# Patient Record
Sex: Female | Born: 1985 | Hispanic: Yes | Marital: Married | Smoking: Never smoker
Health system: Southern US, Community
[De-identification: ages and names within clinical notes are randomized; demographics above are authoritative.]

## PROBLEM LIST (undated history)

## (undated) DIAGNOSIS — R011 Cardiac murmur, unspecified: Secondary | ICD-10-CM

## (undated) DIAGNOSIS — D62 Acute posthemorrhagic anemia: Secondary | ICD-10-CM

## (undated) DIAGNOSIS — J45909 Unspecified asthma, uncomplicated: Secondary | ICD-10-CM

---

## 2013-02-24 LAB — OB RESULTS CONSOLE RUBELLA ANTIBODY, IGM: Rubella: IMMUNE

## 2013-02-24 LAB — OB RESULTS CONSOLE HIV ANTIBODY (ROUTINE TESTING): HIV: NONREACTIVE

## 2013-02-24 LAB — OB RESULTS CONSOLE HEPATITIS B SURFACE ANTIGEN: Hepatitis B Surface Ag: NEGATIVE

## 2013-02-24 LAB — OB RESULTS CONSOLE ABO/RH: RH TYPE: POSITIVE

## 2013-02-24 LAB — OB RESULTS CONSOLE ANTIBODY SCREEN: Antibody Screen: NEGATIVE

## 2013-02-24 LAB — OB RESULTS CONSOLE RPR: RPR: NONREACTIVE

## 2013-02-28 LAB — OB RESULTS CONSOLE GC/CHLAMYDIA
Chlamydia: NEGATIVE
GC PROBE AMP, GENITAL: NEGATIVE

## 2013-05-27 LAB — OB RESULTS CONSOLE GBS: STREP GROUP B AG: NEGATIVE

## 2013-06-18 LAB — OB RESULTS CONSOLE RPR: RPR: NONREACTIVE

## 2013-09-01 ENCOUNTER — Inpatient Hospital Stay (HOSPITAL_COMMUNITY): Payer: BC Managed Care – PPO | Admitting: Anesthesiology

## 2013-09-01 ENCOUNTER — Inpatient Hospital Stay (HOSPITAL_COMMUNITY)
Admission: AD | Admit: 2013-09-01 | Discharge: 2013-09-04 | DRG: 765 | Disposition: A | Discharge status: Home / Self Care | Payer: BC Managed Care – PPO | Source: Ambulatory Visit | Attending: Obstetrics & Gynecology | Admitting: Obstetrics & Gynecology

## 2013-09-01 ENCOUNTER — Encounter (HOSPITAL_COMMUNITY): Payer: Self-pay | Admitting: *Deleted

## 2013-09-01 ENCOUNTER — Encounter (HOSPITAL_COMMUNITY): Payer: Self-pay | Admitting: Anesthesiology

## 2013-09-01 ENCOUNTER — Other Ambulatory Visit: Payer: Self-pay | Admitting: Obstetrics & Gynecology

## 2013-09-01 ENCOUNTER — Encounter (HOSPITAL_COMMUNITY)
Admission: AD | Disposition: A | Discharge status: Home / Self Care | Payer: Self-pay | Source: Ambulatory Visit | Attending: Obstetrics & Gynecology

## 2013-09-01 ENCOUNTER — Encounter (HOSPITAL_COMMUNITY): Payer: BC Managed Care – PPO | Admitting: Anesthesiology

## 2013-09-01 DIAGNOSIS — Z9889 Other specified postprocedural states: Secondary | ICD-10-CM

## 2013-09-01 DIAGNOSIS — D62 Acute posthemorrhagic anemia: Secondary | ICD-10-CM | POA: Diagnosis present

## 2013-09-01 DIAGNOSIS — O321XX0 Maternal care for breech presentation, not applicable or unspecified: Principal | ICD-10-CM | POA: Diagnosis present

## 2013-09-01 DIAGNOSIS — O9903 Anemia complicating the puerperium: Secondary | ICD-10-CM | POA: Diagnosis present

## 2013-09-01 DIAGNOSIS — J45909 Unspecified asthma, uncomplicated: Secondary | ICD-10-CM | POA: Diagnosis present

## 2013-09-01 HISTORY — DX: Unspecified asthma, uncomplicated: J45.909

## 2013-09-01 HISTORY — DX: Cardiac murmur, unspecified: R01.1

## 2013-09-01 LAB — TYPE AND SCREEN
ABO/RH(D): O POS
ANTIBODY SCREEN: NEGATIVE

## 2013-09-01 LAB — CBC
HCT: 33.8 % — ABNORMAL LOW (ref 36.0–46.0)
HEMOGLOBIN: 11.3 g/dL — AB (ref 12.0–15.0)
MCH: 29.1 pg (ref 26.0–34.0)
MCHC: 33.4 g/dL (ref 30.0–36.0)
MCV: 87.1 fL (ref 78.0–100.0)
Platelets: 252 10*3/uL (ref 150–400)
RBC: 3.88 MIL/uL (ref 3.87–5.11)
RDW: 13.2 % (ref 11.5–15.5)
WBC: 6.7 10*3/uL (ref 4.0–10.5)

## 2013-09-01 LAB — RPR: RPR: NONREACTIVE

## 2013-09-01 LAB — ABO/RH: ABO/RH(D): O POS

## 2013-09-01 SURGERY — Surgical Case
Anesthesia: Spinal | Site: Abdomen

## 2013-09-01 MED ORDER — OXYTOCIN 40 UNITS IN LACTATED RINGERS INFUSION - SIMPLE MED: 62.5000 mL/h | INTRAVENOUS | Status: DC

## 2013-09-01 MED ORDER — IBUPROFEN 600 MG PO TABS: 600.0000 mg | ORAL_TABLET | Freq: Four times a day (QID) | ORAL | Status: DC | PRN

## 2013-09-01 MED ORDER — KETOROLAC TROMETHAMINE 30 MG/ML IJ SOLN
30.0000 mg | Freq: Four times a day (QID) | INTRAMUSCULAR | Status: DC | PRN
  Administered 2013-09-02: 30 mg via INTRAMUSCULAR

## 2013-09-01 MED ORDER — KETOROLAC TROMETHAMINE 30 MG/ML IJ SOLN: 30.0000 mg | Freq: Four times a day (QID) | INTRAMUSCULAR | Status: DC | PRN

## 2013-09-01 MED ORDER — OXYCODONE-ACETAMINOPHEN 5-325 MG PO TABS: 1.0000 | ORAL_TABLET | ORAL | Status: DC | PRN

## 2013-09-01 MED ORDER — ONDANSETRON HCL 4 MG/2ML IJ SOLN
INTRAMUSCULAR | Status: AC
  Filled 2013-09-01: qty 2

## 2013-09-01 MED ORDER — FENTANYL CITRATE 0.05 MG/ML IJ SOLN
INTRAMUSCULAR | Status: DC | PRN
  Administered 2013-09-01: 25 ug via INTRATHECAL

## 2013-09-01 MED ORDER — LACTATED RINGERS IV SOLN: 500.0000 mL | INTRAVENOUS | Status: DC | PRN

## 2013-09-01 MED ORDER — PHENYLEPHRINE 8 MG IN D5W 100 ML (0.08MG/ML) PREMIX OPTIME
INJECTION | INTRAVENOUS | Status: AC
  Filled 2013-09-01: qty 100

## 2013-09-01 MED ORDER — ACETAMINOPHEN 325 MG PO TABS: 650.0000 mg | ORAL_TABLET | ORAL | Status: DC | PRN

## 2013-09-01 MED ORDER — MORPHINE SULFATE (PF) 0.5 MG/ML IJ SOLN
INTRAMUSCULAR | Status: DC | PRN
  Administered 2013-09-01: .15 mg via INTRATHECAL

## 2013-09-01 MED ORDER — PHENYLEPHRINE 40 MCG/ML (10ML) SYRINGE FOR IV PUSH (FOR BLOOD PRESSURE SUPPORT)
80.0000 ug | PREFILLED_SYRINGE | INTRAVENOUS | Status: DC | PRN
  Filled 2013-09-01: qty 10

## 2013-09-01 MED ORDER — CITRIC ACID-SODIUM CITRATE 334-500 MG/5ML PO SOLN
30.0000 mL | ORAL | Status: DC | PRN
  Administered 2013-09-01: 30 mL via ORAL
  Filled 2013-09-01: qty 15

## 2013-09-01 MED ORDER — EPHEDRINE 5 MG/ML INJ: 10.0000 mg | INTRAVENOUS | Status: DC | PRN

## 2013-09-01 MED ORDER — BUPIVACAINE HCL (PF) 0.25 % IJ SOLN
INTRAMUSCULAR | Status: DC | PRN
  Administered 2013-09-01: 20 mL

## 2013-09-01 MED ORDER — LACTATED RINGERS IV SOLN
INTRAVENOUS | Status: DC
  Administered 2013-09-01 (×4): via INTRAVENOUS

## 2013-09-01 MED ORDER — FENTANYL CITRATE 0.05 MG/ML IJ SOLN
INTRAMUSCULAR | Status: AC
  Filled 2013-09-01: qty 2

## 2013-09-01 MED ORDER — OXYTOCIN BOLUS FROM INFUSION: 500.0000 mL | INTRAVENOUS | Status: DC

## 2013-09-01 MED ORDER — EPHEDRINE 5 MG/ML INJ
10.0000 mg | INTRAVENOUS | Status: DC | PRN
  Filled 2013-09-01: qty 4

## 2013-09-01 MED ORDER — MORPHINE SULFATE 0.5 MG/ML IJ SOLN
INTRAMUSCULAR | Status: AC
  Filled 2013-09-01: qty 10

## 2013-09-01 MED ORDER — ONDANSETRON HCL 4 MG/2ML IJ SOLN: 4.0000 mg | Freq: Four times a day (QID) | INTRAMUSCULAR | Status: DC | PRN

## 2013-09-01 MED ORDER — LIDOCAINE HCL (PF) 1 % IJ SOLN: 30.0000 mL | INTRAMUSCULAR | Status: DC | PRN

## 2013-09-01 MED ORDER — FLEET ENEMA 7-19 GM/118ML RE ENEM: 1.0000 | ENEMA | RECTAL | Status: DC | PRN

## 2013-09-01 MED ORDER — OXYTOCIN 10 UNIT/ML IJ SOLN
40.0000 [IU] | INTRAVENOUS | Status: DC | PRN
  Administered 2013-09-01: 40 [IU] via INTRAVENOUS

## 2013-09-01 MED ORDER — OXYTOCIN 10 UNIT/ML IJ SOLN
INTRAMUSCULAR | Status: AC
  Filled 2013-09-01: qty 4

## 2013-09-01 MED ORDER — HYDROMORPHONE HCL PF 1 MG/ML IJ SOLN
0.2500 mg | INTRAMUSCULAR | Status: DC | PRN
  Administered 2013-09-02 (×4): 0.5 mg via INTRAVENOUS

## 2013-09-01 MED ORDER — ONDANSETRON HCL 4 MG/2ML IJ SOLN
INTRAMUSCULAR | Status: DC | PRN
  Administered 2013-09-01: 4 mg via INTRAVENOUS

## 2013-09-01 MED ORDER — SCOPOLAMINE 1 MG/3DAYS TD PT72
1.0000 | MEDICATED_PATCH | Freq: Once | TRANSDERMAL | Status: DC
  Administered 2013-09-02: 1.5 mg via TRANSDERMAL

## 2013-09-01 MED ORDER — PHENYLEPHRINE 40 MCG/ML (10ML) SYRINGE FOR IV PUSH (FOR BLOOD PRESSURE SUPPORT): 80.0000 ug | PREFILLED_SYRINGE | INTRAVENOUS | Status: DC | PRN

## 2013-09-01 MED ORDER — MEPERIDINE HCL 25 MG/ML IJ SOLN: 6.2500 mg | INTRAMUSCULAR | Status: DC | PRN

## 2013-09-01 MED ORDER — TERBUTALINE SULFATE 1 MG/ML IJ SOLN: 0.2500 mg | Freq: Once | INTRAMUSCULAR | Status: AC | PRN

## 2013-09-01 MED ORDER — LACTATED RINGERS IV SOLN
500.0000 mL | Freq: Once | INTRAVENOUS | Status: AC
  Administered 2013-09-01: 500 mL via INTRAVENOUS

## 2013-09-01 MED ORDER — BUPIVACAINE HCL (PF) 0.25 % IJ SOLN
INTRAMUSCULAR | Status: AC
  Filled 2013-09-01: qty 10

## 2013-09-01 MED ORDER — FENTANYL 2.5 MCG/ML BUPIVACAINE 1/10 % EPIDURAL INFUSION (WH - ANES)
14.0000 mL/h | INTRAMUSCULAR | Status: DC | PRN
  Filled 2013-09-01: qty 125

## 2013-09-01 MED ORDER — PHENYLEPHRINE 8 MG IN D5W 100 ML (0.08MG/ML) PREMIX OPTIME
INJECTION | INTRAVENOUS | Status: DC | PRN
  Administered 2013-09-01: 80 ug/min via INTRAVENOUS

## 2013-09-01 MED ORDER — DIPHENHYDRAMINE HCL 50 MG/ML IJ SOLN: 12.5000 mg | INTRAMUSCULAR | Status: DC | PRN

## 2013-09-01 MED ORDER — OXYTOCIN 40 UNITS IN LACTATED RINGERS INFUSION - SIMPLE MED
1.0000 m[IU]/min | INTRAVENOUS | Status: DC
  Administered 2013-09-01: 2 m[IU]/min via INTRAVENOUS
  Filled 2013-09-01: qty 1000

## 2013-09-01 MED ORDER — CEFAZOLIN SODIUM-DEXTROSE 2-3 GM-% IV SOLR
2.0000 g | Freq: Once | INTRAVENOUS | Status: AC
  Administered 2013-09-01: 2 g via INTRAVENOUS
  Filled 2013-09-01: qty 50

## 2013-09-01 MED ORDER — BUPIVACAINE HCL (PF) 0.25 % IJ SOLN
INTRAMUSCULAR | Status: AC
  Filled 2013-09-01: qty 20

## 2013-09-01 SURGICAL SUPPLY — 36 items
CLAMP CORD UMBIL (MISCELLANEOUS) IMPLANT
CLOTH BEACON ORANGE TIMEOUT ST (SAFETY) ×3 IMPLANT
CONTAINER PREFILL 10% NBF 15ML (MISCELLANEOUS) IMPLANT
DRAPE LG THREE QUARTER DISP (DRAPES) IMPLANT
DRSG OPSITE POSTOP 4X10 (GAUZE/BANDAGES/DRESSINGS) ×3 IMPLANT
DURAPREP 26ML APPLICATOR (WOUND CARE) ×3 IMPLANT
ELECT REM PT RETURN 9FT ADLT (ELECTROSURGICAL) ×3
ELECTRODE REM PT RTRN 9FT ADLT (ELECTROSURGICAL) ×1 IMPLANT
EXTRACTOR VACUUM M CUP 4 TUBE (SUCTIONS) IMPLANT
EXTRACTOR VACUUM M CUP 4' TUBE (SUCTIONS)
GLOVE BIO SURGEON STRL SZ 6.5 (GLOVE) ×2 IMPLANT
GLOVE BIO SURGEONS STRL SZ 6.5 (GLOVE) ×1
GLOVE BIOGEL PI IND STRL 7.0 (GLOVE) ×1 IMPLANT
GLOVE BIOGEL PI INDICATOR 7.0 (GLOVE) ×2
GOWN STRL REUS W/TWL LRG LVL3 (GOWN DISPOSABLE) ×6 IMPLANT
KIT ABG SYR 3ML LUER SLIP (SYRINGE) IMPLANT
NEEDLE HYPO 22GX1.5 SAFETY (NEEDLE) ×3 IMPLANT
NEEDLE HYPO 25X5/8 SAFETYGLIDE (NEEDLE) IMPLANT
PACK C SECTION WH (CUSTOM PROCEDURE TRAY) ×3 IMPLANT
PAD OB MATERNITY 4.3X12.25 (PERSONAL CARE ITEMS) ×3 IMPLANT
RTRCTR C-SECT PINK 25CM LRG (MISCELLANEOUS) ×3 IMPLANT
SPONGE LAP 18X18 X RAY DECT (DISPOSABLE) ×3 IMPLANT
STAPLER VISISTAT 35W (STAPLE) ×3 IMPLANT
SUT PLAIN 0 NONE (SUTURE) IMPLANT
SUT VIC AB 0 CT1 27 (SUTURE) ×4
SUT VIC AB 0 CT1 27XBRD ANBCTR (SUTURE) ×2 IMPLANT
SUT VIC AB 0 CTX 36 (SUTURE) ×8
SUT VIC AB 0 CTX36XBRD ANBCTRL (SUTURE) ×4 IMPLANT
SUT VIC AB 2-0 CT1 27 (SUTURE) ×2
SUT VIC AB 2-0 CT1 TAPERPNT 27 (SUTURE) ×1 IMPLANT
SUT VIC AB 3-0 SH 27 (SUTURE)
SUT VIC AB 3-0 SH 27X BRD (SUTURE) IMPLANT
SYR CONTROL 10ML LL (SYRINGE) ×3 IMPLANT
TOWEL OR 17X24 6PK STRL BLUE (TOWEL DISPOSABLE) ×3 IMPLANT
TRAY FOLEY CATH 14FR (SET/KITS/TRAYS/PACK) ×3 IMPLANT
WATER STERILE IRR 1000ML POUR (IV SOLUTION) IMPLANT

## 2013-09-01 NOTE — Anesthesia Preprocedure Evaluation (Signed)
Anesthesia Evaluation  Patient identified by MRN, date of birth, ID band Patient awake    Reviewed: Allergy & Precautions, H&P , Patient's Chart, lab work & pertinent test results  Airway Mallampati: II TM Distance: >3 FB Neck ROM: full    Dental no notable dental hx.    Pulmonary asthma (no inhaler x 2 years) ,  breath sounds clear to auscultation  Pulmonary exam normal       Cardiovascular Exercise Tolerance: Good Rhythm:regular Rate:Normal     Neuro/Psych    GI/Hepatic   Endo/Other    Renal/GU      Musculoskeletal   Abdominal   Peds  Hematology   Anesthesia Other Findings   Reproductive/Obstetrics                           Anesthesia Physical Anesthesia Plan  ASA: II  Anesthesia Plan: Spinal   Post-op Pain Management:    Induction:   Airway Management Planned:   Additional Equipment:   Intra-op Plan:   Post-operative Plan:   Informed Consent: I have reviewed the patients History and Physical, chart, labs and discussed the procedure including the risks, benefits and alternatives for the proposed anesthesia with the patient or authorized representative who has indicated his/her understanding and acceptance.   Dental Advisory Given  Plan Discussed with: CRNA  Anesthesia Plan Comments: (Lab work confirmed with CRNA in room. Platelets okay. Discussed spinal anesthetic, and patient consents to the procedure:  included risk of possible headache,backache, failed block, allergic reaction, and nerve injury. This patient was asked if she had any questions or concerns before the procedure started. )        Anesthesia Quick Evaluation

## 2013-09-01 NOTE — Progress Notes (Signed)
Patient refused vaginal exam

## 2013-09-01 NOTE — Consult Note (Signed)
Neonatology Note:  Attendance at C-section:  I was asked by Dr. Lavoie to attend this primary C/S at term due to breech presentation, in labor. The mother is a G1P0 O pos, GBS neg with an otherwise uncomplicated pregnancy. ROM 15 hours prior to delivery, fluid clear. Infant a little floppy initially, but cried with bulb suctioning and pinked up quickly. Tone normal by 2-3 min of life. Needed bulb suctioning for clear secretions. Ap 8/9. Lungs clear to ausc in DR. To CN to care of Pediatrician.  Macenzie Burford C. Hawley Michel, MD  

## 2013-09-01 NOTE — Progress Notes (Signed)
Bedside U/S performed by Dr. Seymour BarsLavoie, breech position identified. Dr. Seymour BarsLavoie discussed c-section with patient.

## 2013-09-01 NOTE — Progress Notes (Signed)
Subjective: SROM, Pito augmentation Doing well, pain ++, UCs q3 min  Anesthesia none   Objective: BP 113/82  Pulse 89  Temp(Src) 98.2 F (36.8 C) (Oral)  Resp 18  Ht 5\' 2"  (1.575 m)  Wt 54.885 kg (121 lb)  BMI 22.13 kg/m2   FHT:  FHR: 140 bpm, variability: moderate,  accelerations:  Present,  decelerations:  Absent UC:   regular, every 3 minutes VE:   Dilation: 4 Effacement (%): 70 Station: -3 Exam by:: Dr. Seymour BarsLavoie  US at bedside because still high presentation and difficult exam due to pain:  Breech presentation.  Assessment / Plan: Augmentation of labor, progressing well, but breech presentation per bedside US.  Fetal Wellbeing:  Category I Pain Control:  none  Anticipated MOD:  Urgent C/S for Breech in labor.  Surgery and risks reviewed including trauma, hemorrhage, infection, DVT/PE, Anesthesia Cx.  Informed consent signed.  Lonette Stevison,MARIE-LYNE 09/01/2013, 10:47 PM

## 2013-09-01 NOTE — H&P (Signed)
Subjective:  Joy ClevelandJessica Hannold is a 28 y.o. G1 P0 female with EDC 09/08/2013 at 3839 and 0/[redacted] weeks gestation who is being admitted for labor management.  Her current obstetrical history is normal.  Patient reports clear AF leak x 8:30 am.   Fetal Movement: normal.     Objective:   Vital signs in last 24 hours: Temp:  [98.6 F (37 C)-99.3 F (37.4 C)] 99.3 F (37.4 C) (02/09 1311) Pulse Rate:  [87-123] 93 (02/09 1500) Resp:  [18-20] 18 (02/09 1500) BP: (109-128)/(74-80) 111/78 mmHg (02/09 1500) Weight:  [54.885 kg (121 lb)] 54.885 kg (121 lb) (02/09 1119)   General:   alert  Skin:   normal  HEENT:  PERRLA  Lungs:   clear to auscultation bilaterally  Heart:  RCR  Breasts:   Deferred  Abdomen:  Gravid  Pelvis:  Exam deferred.  FHT:  140 BPM, good variability, no deceleration  Uterine Size: size equals dates  Presentations: cephalic  Cervix:    Dilation: 1cm   Effacement: 50%   Station:  -2   Consistency: soft   Position: middle   Lab Review  Rh+  AFP:NML  One hour GTT: Normal   GBS neg  Assessment/Plan:  39 and 0/[redacted] weeks gestation.  SROM. Early latent labor.  Pitocin augmentation. Obstetrical history is normal.  Fetal well-being is reassuring.    Risks, benefits, alternatives and possible complications have been discussed in detail with the patient.  Pre-admission, admission, and post admission procedures and expectations were discussed in detail.  All questions answered, all appropriate consents will be signed at the Hospital. Admission is planned for today.  Expectant management. and Anticipate vaginal delivery.

## 2013-09-01 NOTE — Anesthesia Procedure Notes (Signed)

## 2013-09-01 NOTE — Progress Notes (Signed)
Pt transferred to OR via stretcher, no s/s of distress noted.

## 2013-09-02 ENCOUNTER — Encounter (HOSPITAL_COMMUNITY): Payer: Self-pay

## 2013-09-02 DIAGNOSIS — Z9889 Other specified postprocedural states: Secondary | ICD-10-CM

## 2013-09-02 LAB — CBC
HCT: 26.2 % — ABNORMAL LOW (ref 36.0–46.0)
HEMOGLOBIN: 8.8 g/dL — AB (ref 12.0–15.0)
MCH: 29.2 pg (ref 26.0–34.0)
MCHC: 33.6 g/dL (ref 30.0–36.0)
MCV: 87 fL (ref 78.0–100.0)
Platelets: 182 10*3/uL (ref 150–400)
RBC: 3.01 MIL/uL — AB (ref 3.87–5.11)
RDW: 13.3 % (ref 11.5–15.5)
WBC: 11 10*3/uL — ABNORMAL HIGH (ref 4.0–10.5)

## 2013-09-02 MED ORDER — SIMETHICONE 80 MG PO CHEW
80.0000 mg | CHEWABLE_TABLET | Freq: Three times a day (TID) | ORAL | Status: DC
  Administered 2013-09-02 – 2013-09-04 (×7): 80 mg via ORAL
  Filled 2013-09-02 (×7): qty 1

## 2013-09-02 MED ORDER — MAGNESIUM HYDROXIDE 400 MG/5ML PO SUSP: 30.0000 mL | ORAL | Status: DC | PRN

## 2013-09-02 MED ORDER — SIMETHICONE 80 MG PO CHEW: 80.0000 mg | CHEWABLE_TABLET | ORAL | Status: DC | PRN

## 2013-09-02 MED ORDER — KETOROLAC TROMETHAMINE 30 MG/ML IJ SOLN
INTRAMUSCULAR | Status: AC
  Administered 2013-09-02: 30 mg via INTRAMUSCULAR
  Filled 2013-09-02: qty 1

## 2013-09-02 MED ORDER — ONDANSETRON HCL 4 MG/2ML IJ SOLN: 4.0000 mg | INTRAMUSCULAR | Status: DC | PRN

## 2013-09-02 MED ORDER — HYDROMORPHONE HCL PF 1 MG/ML IJ SOLN
INTRAMUSCULAR | Status: AC
  Administered 2013-09-02: 0.5 mg via INTRAVENOUS
  Filled 2013-09-02: qty 1

## 2013-09-02 MED ORDER — METOCLOPRAMIDE HCL 5 MG/ML IJ SOLN
10.0000 mg | Freq: Three times a day (TID) | INTRAMUSCULAR | Status: DC | PRN
Start: 2013-09-02 — End: 2013-09-04

## 2013-09-02 MED ORDER — IBUPROFEN 600 MG PO TABS
600.0000 mg | ORAL_TABLET | Freq: Four times a day (QID) | ORAL | Status: DC
  Administered 2013-09-02 – 2013-09-04 (×9): 600 mg via ORAL
  Filled 2013-09-02 (×9): qty 1

## 2013-09-02 MED ORDER — MENTHOL 3 MG MT LOZG: 1.0000 | LOZENGE | OROMUCOSAL | Status: DC | PRN

## 2013-09-02 MED ORDER — LACTATED RINGERS IV SOLN: INTRAVENOUS | Status: DC

## 2013-09-02 MED ORDER — NALBUPHINE HCL 10 MG/ML IJ SOLN: 5.0000 mg | INTRAMUSCULAR | Status: DC | PRN

## 2013-09-02 MED ORDER — DIPHENHYDRAMINE HCL 50 MG/ML IJ SOLN: 25.0000 mg | INTRAMUSCULAR | Status: DC | PRN

## 2013-09-02 MED ORDER — OXYTOCIN 40 UNITS IN LACTATED RINGERS INFUSION - SIMPLE MED: 62.5000 mL/h | INTRAVENOUS | Status: AC

## 2013-09-02 MED ORDER — OXYCODONE-ACETAMINOPHEN 5-325 MG PO TABS
1.0000 | ORAL_TABLET | ORAL | Status: DC | PRN
  Administered 2013-09-02 – 2013-09-03 (×5): 1 via ORAL
  Administered 2013-09-04: 2 via ORAL
  Administered 2013-09-04: 1 via ORAL
  Administered 2013-09-04 (×2): 2 via ORAL
  Filled 2013-09-02 (×3): qty 1
  Filled 2013-09-02: qty 2
  Filled 2013-09-02: qty 1
  Filled 2013-09-02: qty 2
  Filled 2013-09-02 (×2): qty 1
  Filled 2013-09-02: qty 2
  Filled 2013-09-02: qty 1

## 2013-09-02 MED ORDER — WITCH HAZEL-GLYCERIN EX PADS: 1.0000 "application " | MEDICATED_PAD | CUTANEOUS | Status: DC | PRN

## 2013-09-02 MED ORDER — SCOPOLAMINE 1 MG/3DAYS TD PT72
MEDICATED_PATCH | TRANSDERMAL | Status: AC
  Administered 2013-09-02: 1.5 mg via TRANSDERMAL
  Filled 2013-09-02: qty 1

## 2013-09-02 MED ORDER — LANOLIN HYDROUS EX OINT: 1.0000 "application " | TOPICAL_OINTMENT | CUTANEOUS | Status: DC | PRN

## 2013-09-02 MED ORDER — DIBUCAINE 1 % RE OINT: 1.0000 "application " | TOPICAL_OINTMENT | RECTAL | Status: DC | PRN

## 2013-09-02 MED ORDER — PRENATAL MULTIVITAMIN CH
1.0000 | ORAL_TABLET | Freq: Every day | ORAL | Status: DC
  Administered 2013-09-02 – 2013-09-04 (×3): 1 via ORAL
  Filled 2013-09-02 (×3): qty 1

## 2013-09-02 MED ORDER — SENNOSIDES-DOCUSATE SODIUM 8.6-50 MG PO TABS
2.0000 | ORAL_TABLET | ORAL | Status: DC
  Administered 2013-09-03 (×2): 2 via ORAL
  Filled 2013-09-02 (×2): qty 2

## 2013-09-02 MED ORDER — DIPHENHYDRAMINE HCL 25 MG PO CAPS: 25.0000 mg | ORAL_CAPSULE | Freq: Four times a day (QID) | ORAL | Status: DC | PRN

## 2013-09-02 MED ORDER — SODIUM CHLORIDE 0.9 % IJ SOLN: 3.0000 mL | INTRAMUSCULAR | Status: DC | PRN

## 2013-09-02 MED ORDER — TETANUS-DIPHTH-ACELL PERTUSSIS 5-2.5-18.5 LF-MCG/0.5 IM SUSP: 0.5000 mL | Freq: Once | INTRAMUSCULAR | Status: DC

## 2013-09-02 MED ORDER — ZOLPIDEM TARTRATE 5 MG PO TABS: 5.0000 mg | ORAL_TABLET | Freq: Every evening | ORAL | Status: DC | PRN

## 2013-09-02 MED ORDER — ONDANSETRON HCL 4 MG/2ML IJ SOLN
4.0000 mg | Freq: Three times a day (TID) | INTRAMUSCULAR | Status: DC | PRN
Start: 2013-09-02 — End: 2013-09-04

## 2013-09-02 MED ORDER — DIPHENHYDRAMINE HCL 50 MG/ML IJ SOLN: 12.5000 mg | INTRAMUSCULAR | Status: DC | PRN

## 2013-09-02 MED ORDER — SIMETHICONE 80 MG PO CHEW
80.0000 mg | CHEWABLE_TABLET | ORAL | Status: DC
  Administered 2013-09-03 (×2): 80 mg via ORAL
  Filled 2013-09-02 (×2): qty 1

## 2013-09-02 MED ORDER — LACTATED RINGERS IV SOLN
INTRAVENOUS | Status: DC | PRN
  Administered 2013-09-02: via INTRAVENOUS

## 2013-09-02 MED ORDER — ONDANSETRON HCL 4 MG PO TABS: 4.0000 mg | ORAL_TABLET | ORAL | Status: DC | PRN

## 2013-09-02 MED ORDER — NALOXONE HCL 1 MG/ML IJ SOLN
1.0000 ug/kg/h | INTRAVENOUS | Status: DC | PRN
  Filled 2013-09-02: qty 2

## 2013-09-02 MED ORDER — DIPHENHYDRAMINE HCL 25 MG PO CAPS: 25.0000 mg | ORAL_CAPSULE | ORAL | Status: DC | PRN

## 2013-09-02 MED ORDER — NALOXONE HCL 0.4 MG/ML IJ SOLN: 0.4000 mg | INTRAMUSCULAR | Status: DC | PRN

## 2013-09-02 MED ORDER — FERROUS SULFATE 325 (65 FE) MG PO TABS
325.0000 mg | ORAL_TABLET | Freq: Two times a day (BID) | ORAL | Status: DC
  Administered 2013-09-02 – 2013-09-04 (×6): 325 mg via ORAL
  Filled 2013-09-02 (×6): qty 1

## 2013-09-02 NOTE — Anesthesia Postprocedure Evaluation (Signed)
  Anesthesia Post-op Note  Patient: Joy Farmer  Procedure(s) Performed: * No procedures listed *  Patient Location: PACU and Mother/Baby  Anesthesia Type:Epidural  Level of Consciousness: awake, alert  and oriented  Airway and Oxygen Therapy: Patient Spontanous Breathing  Post-op Pain: mild  Post-op Assessment: Patient's Cardiovascular Status Stable, Respiratory Function Stable, No signs of Nausea or vomiting, Adequate PO intake and Pain level controlled  Post-op Vital Signs: stable  Complications: No apparent anesthesia complications

## 2013-09-02 NOTE — Transfer of Care (Deleted)
Immediate Anesthesia Transfer of Care Note  Patient: Joy Farmer  Procedure(s) Performed: Procedure(s): CESAREAN SECTION (N/A)  Patient Location: PACU  Anesthesia Type:Spinal  Level of Consciousness: awake, alert , oriented and patient cooperative  Airway & Oxygen Therapy: Patient Spontanous Breathing  Post-op Assessment: Report given to PACU RN and Post -op Vital signs reviewed and stable  Post vital signs: Reviewed and stable  Complications: No apparent anesthesia complications 

## 2013-09-02 NOTE — Lactation Note (Signed)
This note was copied from the chart of Joy Farmer Cotta. Lactation Consultation Note  Patient Name: Joy Farmer Haning ZOXWR'UToday's Date: 09/02/2013 Reason for consult: Follow-up assessment of this primipara and her baby, now 20 hours of age and having some latch difficulty due to shallow latch.  FOB at bedside to assist and is very supportive.  Mom has baby well-positioned in sidelying football position to (R) breast and baby is latched and lips flanged wide but mom c/o pinching of nipple. LC attempting to adjust baby and he slipped off, so LC demonstrated chin tug technique while baby latches to ensure deeper areolar grasp.  Mom reports some persistent nipple tenderness but some improvement of nipple pain and baby sustained latch >8 minutes and some swallows initially which increased with let-down and were more frequent.  LC also demonstrated stimulation techniques for FOB to use if baby pauses too long.  RN, Randa EvensJoanne also observed a few minutes of this feeding.  LC encouraged cue feeding on one or both breasts and cautioned mom not to pull back on breast tissue while baby is latched but encouraged breast compressions in direction of baby's mouth to enhance let-down and milk flow for baby.   Maternal Data    Feeding Feeding Type: Breast Fed Length of feed:  (observed >8 minutes of deep latch)  LATCH Score/Interventions Latch: Grasps breast easily, tongue down, lips flanged, rhythmical sucking. (chin tug improved latch, achieving deeper areolar grasp) Intervention(s): Skin to skin (chin tug and stimulation technqiues) Intervention(s): Assist with latch;Breast compression (cautioned mom not to pull breast tissue taut)  Audible Swallowing: Spontaneous and intermittent (let-down heard after first 5 minutes) Intervention(s): Skin to skin  Type of Nipple: Everted at rest and after stimulation (firm breasts, short nipples)  Comfort (Breast/Nipple): Filling, red/small blisters or bruises, mild/mod  discomfort (mom reports some tenderness but improvement w/deeper latch)  Problem noted: Mild/Moderate discomfort Interventions (Mild/moderate discomfort): Hand massage  Hold (Positioning): Assistance needed to correctly position infant at breast and maintain latch. Intervention(s): Skin to skin  LATCH Score: 8 (LC and RN observed)  Lactation Tools Discussed/Used   Chin tug STS and stimulation technqiues  Consult Status Consult Status: Follow-up Date: 09/03/13 Follow-up type: In-patient    Warrick ParisianBryant, Yalena Colon [facility] 09/02/2013, 8:40 PM

## 2013-09-02 NOTE — Op Note (Signed)
Preoperative diagnosis: Intrauterine pregnancy at 39 weeks and 0 days                                             Breech presentation in labor  Post operative diagnosis: Same  Anesthesia: Spinal  Anesthesiologist: Dr. Cristela BlueKyle Jackson  Procedure: Urgent primary low transverse cesarean section  Surgeon: Dr. Genia DelMarie-Lyne Melek Pownall  Assistant: none   Estimated blood loss: 700 cc  Procedure:  After being informed of the planned procedure and possible complications including bleeding, infection, injury to other organs, informed consent is obtained. The patient is taken to OR #9 and given spinal anesthesia without complication. She is placed in the dorsal decubitus position with the pelvis tilted to the left. She is then prepped and draped in a sterile fashion. A Foley catheter is inserted in her bladder.  After assessing adequate level of anesthesia, we infiltrate the suprapubic area with 20 cc of Marcaine 0.25 and perform a Pfannenstiel incision which is brought down sharply to the fascia. The fascia is entered in a low transverse fashion. Linea alba is dissected. Peritoneum is entered in a midline fashion. An Alexis retractor is easily positioned. Visceral peritoneum is entered in a low transverse fashion allowing us to safely retract bladder by developing a bladder flap.  The myometrium is then entered in a low transverse fashion; first with knife and then extended bluntly. Amniotic fluid is clear. We assist the birth of a female  infant in complete breech presentation. Mouth and nose are suctioned. The baby is delivered. The cord is clamped and sectioned. The baby is given to the neonatologist present in the room.  10 cc of blood is drawn from the umbilical vein.The placenta is allowed to deliver spontaneously. It is complete and the cord has 3 vessels. Uterine revision is negative.  We proceed with closure of the myometrium in 2 layers: First with a running locked suture of 0 Vicryl, then with a  Lembert suture of 0 Vicryl imbricating the first one. Hemostasis is completed with cauterization on peritoneal edges.  Both paracolic gutters are cleaned. Both tubes and ovaries are assessed and normal.  We confirm a satisfactory hemostasis.  Retractors and sponges are removed. Under fascia hemostasis is completed with cauterization.  The parietal peritoneum is closed with a running suture of Vicryl 0.  The fascia is then closed with 2 running sutures of 0 Vicryl meeting midline. The wound is irrigated with warm saline and hemostasis is completed with cauterization. The skin is closed with staples and a Honeycomb dressing is applied.   Instrument and sponge count is complete x2. Estimated blood loss is 700 cc.  The procedure is well tolerated by the patient who is taken to recovery room in a well and stable condition.  female baby named Theron Aristaeter was born at 23:48 and received an Apgar of 8  at 1 minute and 9 at 5 minutes. Weight was pending.    Specimen: Placenta sent to patho   Chasin Findling,MARIE-LYNE MD 2/10/201512:31 AM

## 2013-09-02 NOTE — Progress Notes (Signed)
Patient ID: Joy Farmer, female   DOB: 12/19/1985, 28 y.o.   MRN: [id number] POD # 1  Subjective: Pt reports feeling "groggy" , "sore" / Pain controlled with intra op meds; has not taken percocet Tolerating po/ Foley patent and draining pale, yellow urine, clear / No n/v/Flatus neg Activity: up with assistance; SCD's in place Bleeding is light Newborn info:  Information for the patient's newborn:  Joy Farmer, Boy Leontyne [[id number]][[id number]]  female  / circ desired / Feeding: breast   Objective: VS: Blood pressure 114/73, pulse 112, temperature 99.2 F (37.3 C), temperature source Oral, resp. rate 18.    Intake/Output Summary (Last 24 hours) at 09/02/13 0914 Last data filed at 09/02/13 0032  Gross per 24 hour  Intake   2700 ml  Output   1300 ml  Net   1400 ml      Recent Labs  09/01/13 1300 09/02/13 0615  WBC 6.7 11.0*  HGB 11.3* 8.8*  HCT 33.8* 26.2*  PLT 252 182    Blood type: O POS Rubella: Immune    Physical Exam:  General: alert, cooperative and no distress CV: Regular rate and rhythm Resp: clear Abdomen: soft, nontender, normal bowel sounds Incision: Covered with Tegaderm and honeycomb dressing; well approximated.  Uterine Fundus: firm, below umbilicus, nontender Lochia: minimal Ext: SCD's in place    A/P: POD # 1/ G1P1001 S/P Primary C/Section d/t Breech in labor Chronic anemia compounded by ABL Anemia Doing well Continue routine post op orders   Signed: Demetrius RevelFISHER,Anyia Gierke K, MSN, [facility] 09/02/2013, 9:14 AM

## 2013-09-02 NOTE — Transfer of Care (Signed)
Immediate Anesthesia Transfer of Care Note  Patient: Dollene ClevelandJessica Kissick  Procedure(s) Performed: Procedure(s): CESAREAN SECTION (N/A)  Patient Location: PACU  Anesthesia Type:Spinal  Level of Consciousness: awake, alert , oriented and patient cooperative  Airway & Oxygen Therapy: Patient Spontanous Breathing  Post-op Assessment: Report given to PACU RN and Post -op Vital signs reviewed and stable  Post vital signs: Reviewed and stable  Complications: No apparent anesthesia complications

## 2013-09-02 NOTE — Anesthesia Postprocedure Evaluation (Signed)
  Anesthesia Post-op Note  Patient: Joy Farmer  Procedure(s) Performed: Procedure(s): CESAREAN SECTION (N/A)  Patient is awake, responsive, moving her legs, and has signs of resolution of her numbness. Pain and nausea are reasonably well controlled. Vital signs are stable and clinically acceptable. Oxygen saturation is clinically acceptable. There are no apparent anesthetic complications at this time. Patient is ready for discharge.

## 2013-09-02 NOTE — Lactation Note (Signed)
This note was copied from the chart of Boy Dollene ClevelandJessica Shively. Lactation Consultation Note Initial consultation, first time mom, baby now 7414 hours old. Mom states baby has been sleepy and has not had a good feeding. Offered to assist, mom accepts. Assisted mom and dad to position baby in football hold, and mom hand expressed some drops of colostrum. Baby was licking and nuzzling but no latch, despite waking techniques.  Enc mom to continue frequent STS and cue based feeding. Discussed baby and me book br feeding basics, community resources, lactation brochure, BFSG.  Enc mom to call for help if she has any concerns.   Patient Name: Boy Dollene ClevelandJessica Minchey VHQIO'NToday's Date: 09/02/2013 Reason for consult: Initial assessment   Maternal Data Formula Feeding for Exclusion: No Has patient been taught Hand Expression?: Yes Does the patient have breastfeeding experience prior to this delivery?: No  Feeding Feeding Type: Breast Fed  LATCH Score/Interventions Latch: Too sleepy or reluctant, no latch achieved, no sucking elicited. Intervention(s): Adjust position;Assist with latch  Audible Swallowing: None Intervention(s): Skin to skin  Type of Nipple: Everted at rest and after stimulation  Comfort (Breast/Nipple): Soft / non-tender     Hold (Positioning): Assistance needed to correctly position infant at breast and maintain latch.  LATCH Score: 6  Lactation Tools Discussed/Used     Consult Status Consult Status: Follow-up Follow-up type: In-patient    Octavio MannsSanders, Lourie Retz [facility] 09/02/2013, 2:31 PM

## 2013-09-03 ENCOUNTER — Encounter (HOSPITAL_COMMUNITY): Payer: Self-pay | Admitting: Obstetrics & Gynecology

## 2013-09-03 NOTE — Lactation Note (Addendum)
This note was copied from the chart of Boy Dollene ClevelandJessica Bensinger. Lactation Consultation Note Follow up consult:  Baby boy 1438 hours old and sleeping.  Reviewed various breastfeeding positions with parents and also suggested they watch channel 11.  Parents states breastfeeding is improving, he latches better on the left breast.  Mother is going to back to school in one week for short periods of time and plans to pump .  Reviewed supply and demand, milk storage and volume guidelines and encouraged her to call for assistance. Patient Name: Boy Dollene ClevelandJessica Colton ZOXWR'UToday's Date: 09/03/2013 Reason for consult: Follow-up assessment   Maternal Data    Feeding Feeding Type: Breast Fed Length of feed: 10 min  LATCH Score/Interventions                      Lactation Tools Discussed/Used     Consult Status      Hardie PulleyBerkelhammer, Audley Hinojos Boschen 09/03/2013, 2:45 PM

## 2013-09-03 NOTE — Progress Notes (Signed)
Patient ID: Joy Farmer, female   DOB: 05/06/1986, 28 y.o.   MRN: [id number] POD # 2  Subjective: Pt reports feeling well.  Mod pain control.  Has taken 2 percocet in past 16 hours. Voiding without problems/ No n/v/Flatus pos Activity: out of bed and ambulate Bleeding is light Newborn info:  Information for the patient's newborn:  Joy Farmer, Joy Farmer [[id number]][[id number]]  female  / circ unsure if plans to do here or do out pt with another provider / Feeding: breast   Objective: VS: BP 98/65  Pulse 88  Temp(Src) 98.5 F (36.9 C) (Oral)  Resp 20  Ht 5\' 2"  (1.575 m)  Wt 54.885 kg (121 lb)  BMI 22.13 kg/m2  SpO2 97%    Physical Exam:  General: alert, cooperative and no distress CV: Regular rate and rhythm Resp: clear Abdomen: soft, nontender, normal bowel sounds Uterine Fundus: firm, below umbilicus, nontender Incision: Covered with large occlusive dressing; C/D/I Lochia: minimal Ext: Homans sign is negative, no sign of DVT and no edema, redness or tenderness in the calves or thighs    A/P: POD # 2/ G1P1001/ S/P Primary C/Section d/t Breech in labor Improve pain control with more regular use of percocet Increase ambulation and time out of bed. Up to shower this am and remove large dressing Continue routine post op orders Anticipate discharge home in the am   Signed: Demetrius RevelFISHER,Renley Gutman K, MSN, [facility] 09/03/2013, 8:49 AM

## 2013-09-04 MED ORDER — OXYCODONE-ACETAMINOPHEN 5-325 MG PO TABS: 1.0000 | ORAL_TABLET | ORAL | Status: DC | PRN

## 2013-09-04 MED ORDER — FERROUS SULFATE 325 (65 FE) MG PO TABS: 325.0000 mg | ORAL_TABLET | Freq: Two times a day (BID) | ORAL | Status: AC

## 2013-09-04 MED ORDER — IBUPROFEN 600 MG PO TABS: 600.0000 mg | ORAL_TABLET | Freq: Four times a day (QID) | ORAL | Status: DC

## 2013-09-04 NOTE — Discharge Summary (Signed)
DISCHARGE SUMMARY:  Patient ID: Dollene ClevelandJessica Mcclary MRN: [id number] DOB/AGE: 29/12/1985 29 y.o.  Admit date: 09/01/2013 Admission Diagnoses: [redacted] weeks gestation, SROM   Discharge date: 09/04/13    Discharge Diagnoses: S/p primary cesarean section, anemia  Prenatal history: G1P1001   EDC : 09/08/2013, by Est. Date of Conception  [facility]  Primary provider : Dr. Seymour BarsLavoie Prenatal course uncomplicated   Prenatal Labs: ABO, Rh: O (08/04 0000)  Antibody: NEG (02/09 1115) Rubella: Immune (08/04 0000)  RPR: NON REACTIVE (02/09 1115)  HBsAg: Negative (08/04 0000)  HIV: Non-reactive (08/04 0000)  GBS: Negative (11/04 0000)  GTT: WNL  Medical / Surgical History :  Past medical history:  Past Medical History  Diagnosis Date  . Heart murmur   . Asthma   . Postpartum care following cesarean delivery (2/9) 09/02/2013  . Postpartum care following cesarean delivery (2/10) 09/02/2013    Past surgical history:  Past Surgical History  Procedure Laterality Date  . Cesarean section N/A 09/01/2013    Procedure: CESAREAN SECTION;  Surgeon: Genia DelMarie-Lyne Lavoie, MD;  Location: [facility];  Service: Obstetrics;  Laterality: N/A;    Family History:  Family History  Problem Relation Age of Onset  . Hypertension Brother   . Diabetes Brother     Social History:  reports that she has never smoked. She has never used smokeless tobacco. She reports that she does not drink alcohol or use illicit drugs.   Allergies: Shellfish allergy and Sulfa antibiotics    Current Medications at time of admission:  Prior to Admission medications   Medication Sig Start Date End Date Taking? Authorizing Provider                       Prenatal Vit-Fe Fumarate-FA (PRENATAL MULTIVITAMIN) TABS tablet Take 1 tablet by mouth daily at 12 noon.   Yes Historical Provider, MD     Intrapartum Course: pitocin augmentation, labor progression, breech presentation identified, primary  CS   Procedures: Cesarean section delivery of female newborn by Dr Seymour BarsLavoie on 09/01/13 See operative report for further details APGAR (1 MIN): 8   APGAR (5 MINS): 9     Postoperative / postpartum course: complicated by ABL anemia  Physical Exam:   VSS: Temp:  [98.5 F (36.9 C)-98.6 F (37 C)] 98.5 F (36.9 C) (02/12 0530) Pulse Rate:  [77-91] 91 (02/12 0530) Resp:  [18-20] 18 (02/12 0530) BP: (106-109)/(68-75) 106/75 mmHg (02/12 0530)  LABS:  Recent Labs  09/01/13 1300 09/02/13 0615  WBC 6.7 11.0*  HGB 11.3* 8.8*  PLT 252 182    General: alert and orinted x3 Heart: RRR Lungs: clear  Abdomen: soft and non-tender / non-distended / active BS  Extremities: no edema / negative Homans  Dressing: c/d/i honeycomb dressing Incision:  approximated with staples / no erythema / no ecchymosis / no drainage  Discharge Instructions:  Discharged Condition: good Activity: pelvic rest and postoperative restrictions x 2 weeks Diet: routine Medications: PNV, Ibuprofen, Iron and Percocet   Medication List         ferrous sulfate 325 (65 FE) MG tablet  Take 1 tablet (325 mg total) by mouth 2 (two) times daily with a meal.     ibuprofen 600 MG tablet  Commonly known as:  ADVIL,MOTRIN  Take 1 tablet (600 mg total) by mouth every 6 (six) hours.     oxyCODONE-acetaminophen 5-325 MG per tablet  Commonly known as:  PERCOCET/ROXICET  Take 1-2 tablets  by mouth every 4 (four) hours as needed for severe pain (moderate - severe pain).     prenatal multivitamin Tabs tablet  Take 1 tablet by mouth daily at 12 noon.       Wound Care: Keep dressing clean and dry, remove in 2-3 days Clean incision with warm water and dry thoroughly Call the office for redness, swelling, or drainage from incision  Postpartum Instructions: [facility] discharge booklet - instructions reviewed Discharge to: Home  Follow up : [facility] in 6 weeks for routine postpartum visit and staple  removal in 4 days                      Signed: Donette Larry, N MSN, CNM 09/04/2013, 12:57 PM

## 2013-09-04 NOTE — Plan of Care (Signed)
Problem: Discharge Progression Outcomes Goal: Remove staples per MD order Outcome: Not Applicable Date Met:  44/71/58 Pt reports she is to return to MD office on Monday, Feb. 16 for staple removal.

## 2013-09-04 NOTE — Progress Notes (Signed)
POD # 3  Subjective: Pt reports feeling well/ Pain controlled with Motrin and Percocet x2 Tolerating po/Voiding without problems/ No n/v/ Flatus present Activity: ad lib Bleeding is light Newborn info:  Information for the patient's newborn:  [name], Boy Tonnia [[id number]][[id number]]  female  / Circumcision: planning/ Feeding: breast   Objective: VS: VS:  Filed Vitals:   09/03/13 0110 09/03/13 0509 09/03/13 1626 09/04/13 0530  BP: 109/73 98/65 109/68 106/75  Pulse: 97 88 77 91  Temp: 99.6 F (37.6 C) 98.5 F (36.9 C) 98.6 F (37 C) 98.5 F (36.9 C)  TempSrc: Oral Oral Oral Oral  Resp: 20 20 20 18   Height:      Weight:      SpO2: 97%       I&O: Intake/Output     02/11 0701 - 02/12 0700 02/12 0701 - 02/13 0700   Urine (mL/kg/hr)     Total Output       Net              LABS:  Recent Labs  09/01/13 1300 09/02/13 0615  WBC 6.7 11.0*  HGB 11.3* 8.8*  PLT 252 182                           Physical Exam:  General: alert and cooperative CV: Regular rate and rhythm Resp: CTA bilaterally Abdomen: soft, nontender, normal bowel sounds Incision: healing well, no drainage, no erythema, no hernia, no seroma, no swelling, well approximated, staples Uterine Fundus: firm, below umbilicus, nontender Lochia: minimal Ext: extremities normal, atraumatic, no cyanosis or edema and Homans sign is negative, no sign of DVT    Assessment: POD # 3/ G1P1001/ S/P C/Section d/t breech Anemia Doing well and stable for discharge home  Plan: Discharge home RX's: Ibuprofen 600mg  po Q 6 hrs prn pain #30 Refill x 1 Percocet 5/325 1 - 2 tabs po every 4 hrs prn pain #30 Refill x 0 Niferex 150mg  po BID #60 Refill x 1 [facility] booklet given    Signed: Donette LarryBHAMBRI, Char Feltman, Dorris CarnesN, MSN, CNM 09/04/2013, 10:20 AM

## 2013-09-04 NOTE — Lactation Note (Signed)
This note was copied from the chart of Joy Dollene ClevelandJessica Schneck. Lactation Consultation Note  Patient Name: Joy Farmer BJYNW'GToday's Date: 09/04/2013 Reason for consult: Follow-up assessment;Difficult latch Baby 58 hours old. Mom reports nipple pain while breastfeeding. States pain is improving with deeper latch. Mom able to latch baby deeply for nursing. Swallows heard. Reviewed basics. Enc to re-latch if shallow. Engorgement prevention/treatment discussed. Mom has received DEBP. Discussed exclusively breastfeeding until returning to school in 3 weeks. Discussed always pumping in place of missed breast feed when at school. Baby will be circumcised later today. Enc mom to give STS and offer breast with cues. Mom given comfort gels and hand pump with instructions. [facility].  Maternal Data    Feeding Feeding Type: Breast Fed Length of feed: 10 min  LATCH Score/Interventions Latch: Grasps breast easily, tongue down, lips flanged, rhythmical sucking. Intervention(s): Assist with latch  Audible Swallowing: Spontaneous and intermittent  Type of Nipple: Everted at rest and after stimulation  Comfort (Breast/Nipple): Filling, red/small blisters or bruises, mild/mod discomfort  Problem noted: Mild/Moderate discomfort Interventions (Mild/moderate discomfort): Comfort gels  Hold (Positioning): No assistance needed to correctly position infant at breast. Intervention(s): Support Pillows  LATCH Score: 9  Lactation Tools Discussed/Used Tools: Comfort gels;Pump Breast pump type: Manual   Consult Status Consult Status: PRN Date: 09/04/13 Follow-up type: In-patient    Geralynn OchsWILLIARD, Jacalyn Biggs 09/04/2013, 10:34 AM

## 2013-09-05 NOTE — Discharge Summary (Signed)
Reviewed and agree with note and plan. V.Emmalene Kattner, MD  

## 2014-05-25 ENCOUNTER — Encounter (HOSPITAL_COMMUNITY): Payer: Self-pay | Admitting: Obstetrics & Gynecology

## 2014-07-24 NOTE — L&D Delivery Note (Signed)
Delivery Note At 5:03 PM a healthy female was delivered via VBAC, Spontaneous (Presentation: ; Occiput Anterior).  APGAR: 7, 8; weight 6 lb 2.8 oz (2800 g).   Placenta status: Intact, Spontaneous.  Cord: 3 vessels with the following complications: None.  Cord pH: pending  Anesthesia: Epidural  Episiotomy: None Lacerations: 2nd degree Suture Repair: 2.0 3.0 vicryl rapide Est. Blood Loss (mL):  555   Mom to postpartum.  Baby to Couplet care / Skin to Skin.  Totiana Everson,MARIE-LYNE 03/04/2015, 5:43 PM

## 2014-08-20 LAB — OB RESULTS CONSOLE ABO/RH: RH Type: POSITIVE

## 2014-08-20 LAB — OB RESULTS CONSOLE RPR: RPR: NONREACTIVE

## 2014-08-20 LAB — OB RESULTS CONSOLE HIV ANTIBODY (ROUTINE TESTING): HIV: NONREACTIVE

## 2014-08-20 LAB — OB RESULTS CONSOLE ANTIBODY SCREEN: Antibody Screen: NEGATIVE

## 2014-08-20 LAB — OB RESULTS CONSOLE RUBELLA ANTIBODY, IGM: Rubella: IMMUNE

## 2014-08-20 LAB — OB RESULTS CONSOLE HEPATITIS B SURFACE ANTIGEN: Hepatitis B Surface Ag: NEGATIVE

## 2014-08-27 LAB — OB RESULTS CONSOLE GC/CHLAMYDIA
CHLAMYDIA, DNA PROBE: NEGATIVE
Gonorrhea: NEGATIVE

## 2015-02-11 LAB — OB RESULTS CONSOLE GBS: STREP GROUP B AG: NEGATIVE

## 2015-03-04 ENCOUNTER — Inpatient Hospital Stay (HOSPITAL_COMMUNITY): Payer: Medicaid Other | Admitting: Anesthesiology

## 2015-03-04 ENCOUNTER — Inpatient Hospital Stay (HOSPITAL_COMMUNITY)
Admission: AD | Admit: 2015-03-04 | Discharge: 2015-03-06 | DRG: 769 | Disposition: A | Discharge status: Home / Self Care | Payer: Medicaid Other | Source: Ambulatory Visit | Attending: Obstetrics & Gynecology | Admitting: Obstetrics & Gynecology

## 2015-03-04 ENCOUNTER — Inpatient Hospital Stay (HOSPITAL_COMMUNITY): Payer: Medicaid Other

## 2015-03-04 ENCOUNTER — Encounter (HOSPITAL_COMMUNITY): Payer: Self-pay

## 2015-03-04 DIAGNOSIS — O3421 Maternal care for scar from previous cesarean delivery: Secondary | ICD-10-CM | POA: Diagnosis present

## 2015-03-04 DIAGNOSIS — O9081 Anemia of the puerperium: Secondary | ICD-10-CM | POA: Diagnosis not present

## 2015-03-04 DIAGNOSIS — O34219 Maternal care for unspecified type scar from previous cesarean delivery: Secondary | ICD-10-CM | POA: Diagnosis not present

## 2015-03-04 DIAGNOSIS — Z833 Family history of diabetes mellitus: Secondary | ICD-10-CM | POA: Diagnosis not present

## 2015-03-04 DIAGNOSIS — D62 Acute posthemorrhagic anemia: Secondary | ICD-10-CM | POA: Diagnosis not present

## 2015-03-04 DIAGNOSIS — Z8249 Family history of ischemic heart disease and other diseases of the circulatory system: Secondary | ICD-10-CM

## 2015-03-04 DIAGNOSIS — Z3A39 39 weeks gestation of pregnancy: Secondary | ICD-10-CM | POA: Diagnosis present

## 2015-03-04 DIAGNOSIS — O329XX0 Maternal care for malpresentation of fetus, unspecified, not applicable or unspecified: Secondary | ICD-10-CM

## 2015-03-04 HISTORY — DX: Acute posthemorrhagic anemia: D62

## 2015-03-04 LAB — TYPE AND SCREEN
ABO/RH(D): O POS
Antibody Screen: NEGATIVE

## 2015-03-04 LAB — CBC
HCT: 35.8 % — ABNORMAL LOW (ref 36.0–46.0)
Hemoglobin: 12.2 g/dL (ref 12.0–15.0)
MCH: 31 pg (ref 26.0–34.0)
MCHC: 34.1 g/dL (ref 30.0–36.0)
MCV: 91.1 fL (ref 78.0–100.0)
PLATELETS: 184 10*3/uL (ref 150–400)
RBC: 3.93 MIL/uL (ref 3.87–5.11)
RDW: 13.3 % (ref 11.5–15.5)
WBC: 6.4 10*3/uL (ref 4.0–10.5)

## 2015-03-04 LAB — POCT FERN TEST
POCT Fern Test: NEGATIVE
POCT Fern Test: POSITIVE

## 2015-03-04 LAB — RPR: RPR Ser Ql: NONREACTIVE

## 2015-03-04 MED ORDER — OXYCODONE-ACETAMINOPHEN 5-325 MG PO TABS: 2.0000 | ORAL_TABLET | ORAL | Status: DC | PRN

## 2015-03-04 MED ORDER — ONDANSETRON HCL 4 MG PO TABS: 4.0000 mg | ORAL_TABLET | ORAL | Status: DC | PRN

## 2015-03-04 MED ORDER — BENZOCAINE-MENTHOL 20-0.5 % EX AERO
1.0000 "application " | INHALATION_SPRAY | CUTANEOUS | Status: DC | PRN
  Filled 2015-03-04: qty 56

## 2015-03-04 MED ORDER — OXYCODONE-ACETAMINOPHEN 5-325 MG PO TABS
2.0000 | ORAL_TABLET | ORAL | Status: DC | PRN
  Administered 2015-03-04: 2 via ORAL
  Filled 2015-03-04: qty 2

## 2015-03-04 MED ORDER — LIDOCAINE HCL (PF) 1 % IJ SOLN
INTRAMUSCULAR | Status: DC | PRN
  Administered 2015-03-04 (×2): 4 mL via EPIDURAL

## 2015-03-04 MED ORDER — PRENATAL MULTIVITAMIN CH
1.0000 | ORAL_TABLET | Freq: Every day | ORAL | Status: DC
  Administered 2015-03-05 – 2015-03-06 (×2): 1 via ORAL
  Filled 2015-03-04 (×2): qty 1

## 2015-03-04 MED ORDER — ACETAMINOPHEN 325 MG PO TABS: 650.0000 mg | ORAL_TABLET | ORAL | Status: DC | PRN

## 2015-03-04 MED ORDER — LIDOCAINE HCL (PF) 1 % IJ SOLN
30.0000 mL | INTRAMUSCULAR | Status: AC | PRN
  Administered 2015-03-04 (×2): 30 mL via SUBCUTANEOUS
  Filled 2015-03-04 (×2): qty 30

## 2015-03-04 MED ORDER — DIPHENHYDRAMINE HCL 50 MG/ML IJ SOLN: 12.5000 mg | INTRAMUSCULAR | Status: DC | PRN

## 2015-03-04 MED ORDER — FENTANYL 2.5 MCG/ML BUPIVACAINE 1/10 % EPIDURAL INFUSION (WH - ANES)
14.0000 mL/h | INTRAMUSCULAR | Status: DC | PRN
  Filled 2015-03-04: qty 125

## 2015-03-04 MED ORDER — OXYTOCIN 40 UNITS IN LACTATED RINGERS INFUSION - SIMPLE MED
1.0000 m[IU]/min | INTRAVENOUS | Status: DC
  Administered 2015-03-04: 2 m[IU]/min via INTRAVENOUS
  Filled 2015-03-04: qty 1000

## 2015-03-04 MED ORDER — DIPHENHYDRAMINE HCL 50 MG/ML IJ SOLN
12.5000 mg | INTRAMUSCULAR | Status: DC | PRN
Start: 2015-03-04 — End: 2015-03-04

## 2015-03-04 MED ORDER — PHENYLEPHRINE 40 MCG/ML (10ML) SYRINGE FOR IV PUSH (FOR BLOOD PRESSURE SUPPORT): 80.0000 ug | PREFILLED_SYRINGE | INTRAVENOUS | Status: DC | PRN

## 2015-03-04 MED ORDER — EPHEDRINE 5 MG/ML INJ
10.0000 mg | INTRAVENOUS | Status: DC | PRN
  Filled 2015-03-04: qty 2

## 2015-03-04 MED ORDER — PHENYLEPHRINE 40 MCG/ML (10ML) SYRINGE FOR IV PUSH (FOR BLOOD PRESSURE SUPPORT)
80.0000 ug | PREFILLED_SYRINGE | INTRAVENOUS | Status: DC | PRN
  Filled 2015-03-04: qty 2
  Filled 2015-03-04: qty 20

## 2015-03-04 MED ORDER — WITCH HAZEL-GLYCERIN EX PADS: 1.0000 "application " | MEDICATED_PAD | CUTANEOUS | Status: DC | PRN

## 2015-03-04 MED ORDER — FENTANYL 2.5 MCG/ML BUPIVACAINE 1/10 % EPIDURAL INFUSION (WH - ANES)
13.0000 mL/h | INTRAMUSCULAR | Status: DC | PRN
  Administered 2015-03-04 (×3): 13 mL/h via EPIDURAL
  Filled 2015-03-04: qty 125

## 2015-03-04 MED ORDER — LANOLIN HYDROUS EX OINT: TOPICAL_OINTMENT | CUTANEOUS | Status: DC | PRN

## 2015-03-04 MED ORDER — ONDANSETRON HCL 4 MG/2ML IJ SOLN: 4.0000 mg | Freq: Four times a day (QID) | INTRAMUSCULAR | Status: DC | PRN

## 2015-03-04 MED ORDER — TERBUTALINE SULFATE 1 MG/ML IJ SOLN
0.2500 mg | Freq: Once | INTRAMUSCULAR | Status: DC | PRN
  Filled 2015-03-04: qty 1

## 2015-03-04 MED ORDER — FLEET ENEMA 7-19 GM/118ML RE ENEM: 1.0000 | ENEMA | RECTAL | Status: DC | PRN

## 2015-03-04 MED ORDER — OXYTOCIN BOLUS FROM INFUSION: 500.0000 mL | INTRAVENOUS | Status: DC

## 2015-03-04 MED ORDER — ZOLPIDEM TARTRATE 5 MG PO TABS: 5.0000 mg | ORAL_TABLET | Freq: Every evening | ORAL | Status: DC | PRN

## 2015-03-04 MED ORDER — IBUPROFEN 600 MG PO TABS
600.0000 mg | ORAL_TABLET | Freq: Four times a day (QID) | ORAL | Status: DC
  Administered 2015-03-04 – 2015-03-06 (×7): 600 mg via ORAL
  Filled 2015-03-04 (×8): qty 1

## 2015-03-04 MED ORDER — DIPHENHYDRAMINE HCL 25 MG PO CAPS: 25.0000 mg | ORAL_CAPSULE | Freq: Four times a day (QID) | ORAL | Status: DC | PRN

## 2015-03-04 MED ORDER — LACTATED RINGERS IV SOLN
INTRAVENOUS | Status: DC
  Administered 2015-03-04 (×2): via INTRAVENOUS

## 2015-03-04 MED ORDER — DIBUCAINE 1 % RE OINT: 1.0000 "application " | TOPICAL_OINTMENT | RECTAL | Status: DC | PRN

## 2015-03-04 MED ORDER — LACTATED RINGERS IV SOLN
500.0000 mL | INTRAVENOUS | Status: DC | PRN
  Administered 2015-03-04 (×2): 500 mL via INTRAVENOUS

## 2015-03-04 MED ORDER — OXYCODONE-ACETAMINOPHEN 5-325 MG PO TABS
1.0000 | ORAL_TABLET | ORAL | Status: DC | PRN
  Administered 2015-03-05 (×2): 1 via ORAL
  Filled 2015-03-04 (×2): qty 1

## 2015-03-04 MED ORDER — CITRIC ACID-SODIUM CITRATE 334-500 MG/5ML PO SOLN: 30.0000 mL | ORAL | Status: DC | PRN

## 2015-03-04 MED ORDER — SIMETHICONE 80 MG PO CHEW: 80.0000 mg | CHEWABLE_TABLET | ORAL | Status: DC | PRN

## 2015-03-04 MED ORDER — OXYTOCIN 40 UNITS IN LACTATED RINGERS INFUSION - SIMPLE MED: 62.5000 mL/h | INTRAVENOUS | Status: DC | PRN

## 2015-03-04 MED ORDER — TETANUS-DIPHTH-ACELL PERTUSSIS 5-2.5-18.5 LF-MCG/0.5 IM SUSP: 0.5000 mL | Freq: Once | INTRAMUSCULAR | Status: DC

## 2015-03-04 MED ORDER — LIDOCAINE HCL (PF) 1 % IJ SOLN: 30.0000 mL | Freq: Once | INTRAMUSCULAR | Status: AC

## 2015-03-04 MED ORDER — OXYTOCIN 40 UNITS IN LACTATED RINGERS INFUSION - SIMPLE MED
62.5000 mL/h | INTRAVENOUS | Status: DC
  Administered 2015-03-04: 999 mL/h via INTRAVENOUS

## 2015-03-04 MED ORDER — SENNOSIDES-DOCUSATE SODIUM 8.6-50 MG PO TABS
2.0000 | ORAL_TABLET | ORAL | Status: DC
  Administered 2015-03-04 – 2015-03-06 (×2): 2 via ORAL
  Filled 2015-03-04 (×2): qty 2

## 2015-03-04 MED ORDER — ONDANSETRON HCL 4 MG/2ML IJ SOLN: 4.0000 mg | INTRAMUSCULAR | Status: DC | PRN

## 2015-03-04 MED ORDER — OXYCODONE-ACETAMINOPHEN 5-325 MG PO TABS
1.0000 | ORAL_TABLET | ORAL | Status: DC | PRN
  Filled 2015-03-04: qty 1

## 2015-03-04 NOTE — MAU Note (Signed)
Water broke at 0105, clear fluid.  Baby moving well today.  No bleeding.

## 2015-03-04 NOTE — H&P (Addendum)
Joy Farmer is a 29 y.o. female G2P1001 [redacted]w[redacted]d presenting for SROM and UCs.  HPI/HPP:  AF leak early am.  UCs with mild discomfort.  No PEC Sx.  H/O C/S for breech, desires TOLAC.  Cephalic presentation confirmed by [place] at MAU.  OB History    Gravida Para Term Preterm AB TAB SAB Ectopic Multiple Living   Past Medical History  Diagnosis Date  . Heart murmur   . Asthma   . Postpartum care following cesarean delivery (2/9) 09/02/2013  . Postpartum care following cesarean delivery (2/10) 09/02/2013   Past Surgical History  Procedure Laterality Date  . Cesarean section N/A 09/01/2013    Procedure: CESAREAN SECTION;  Surgeon: Genia Del, MD;  Location: [facility];  Service: Obstetrics;  Laterality: N/A;   Family History: family history includes Diabetes in her brother; Hypertension in her brother. Social History:  reports that she has never smoked. She has never used smokeless tobacco. She reports that she does not drink alcohol or use illicit drugs.  Allergies  Allergen Reactions  . Shellfish Allergy Anaphylaxis and Hives  . Sulfa Antibiotics     "Not sure. Happened with little"    Dilation: 4.5 Effacement (%): 80 Station: -2 Exam by:: a thacker rn   Blood pressure 105/58, pulse 87, temperature 98.4 F (36.9 C), temperature source Oral, resp. rate 18, height  (1.575 m), weight 123 lb (55.792 kg), SpO2 97 %, unknown if currently breastfeeding. Exam Physical Exam   FHR:  140/min with good variability, no deceleration.  HPP:  Patient Active Problem List   Diagnosis Date Noted  . Normal labor 03/04/2015  . Postpartum care following cesarean delivery (09/01/13) 09/02/2013  . Postoperative state 09/02/2013  . Indication for care in labor or delivery 09/01/2013    Prenatal labs: ABO, Rh: --/--/O POS (08/11 0400) Antibody: NEG (08/11 0400) Rubella:  Immune RPR: Nonreactive (01/28 0000)  HBsAg: Negative (01/28 0000)  HIV: Non-reactive (01/28 0000)   Genetic testing: Quad test neg [place] anato:  wnl 1 hr GTT: wnl GBS: Negative (07/21 0000)   Assessment/Plan: 39+ wks with SROM with spontaneous labor.  FHR Cat 1.  Under epidural.  On Pitocin stimulation.  Progressing in active labor.  Expectant management towards vaginal delivery, TOLAC.  Joy Farmer,Joy Farmer 03/04/2015, 12:53 PM

## 2015-03-04 NOTE — Anesthesia Procedure Notes (Addendum)
Epidural Patient location during procedure: OB Start time: 03/04/2015 7:29 AM  Staffing Anesthesiologist: Mal Amabile Performed by: anesthesiologist   Preanesthetic Checklist Completed: patient identified, site marked, surgical consent, pre-op evaluation, timeout performed, IV checked, risks and benefits discussed and monitors and equipment checked  Epidural Patient position: sitting Prep: site prepped and draped and DuraPrep Patient monitoring: continuous pulse ox and blood pressure Approach: midline Location: L3-L4 Injection technique: LOR air  Needle:  Needle type: Tuohy  Needle gauge: 17 G Needle length: 9 cm and 9 Needle insertion depth: 4 cm Catheter type: closed end flexible Catheter size: 19 Gauge Catheter at skin depth: 9 cm Test dose: negative and Other  Assessment Events: blood not aspirated, injection not painful, no injection resistance, negative IV test and no paresthesia  Additional Notes Patient identified. Risks and benefits discussed including failed block, incomplete  Pain control, post dural puncture headache, nerve damage, paralysis, blood pressure Changes, nausea, vomiting, reactions to medications-both toxic and allergic and post Partum back pain. All questions were answered. Patient expressed understanding and wished to proceed. Sterile technique was used throughout procedure. Epidural site was Dressed with sterile barrier dressing. No paresthesias, signs of intravascular injection Or signs of intrathecal spread were encountered.  Patient was more comfortable after the epidural was dosed. Please see RN's note for documentation of vital signs and FHR which are stable.

## 2015-03-04 NOTE — Progress Notes (Signed)
S: feels some pressure  O: pitocin VE 8.5cm per RN Tracing: baseline 150 (+) variables with ctx Ctx q 2-4 mins  IMP: VBAC P) cont present mgmt

## 2015-03-04 NOTE — Anesthesia Preprocedure Evaluation (Signed)
Anesthesia Evaluation  Patient identified by MRN, date of birth, ID band Patient awake    Reviewed: Allergy & Precautions, Patient's Chart, lab work & pertinent test results  Airway Mallampati: II  TM Distance: >3 FB Neck ROM: Full    Dental no notable dental hx. (+) Teeth Intact   Pulmonary asthma ,  breath sounds clear to auscultation  Pulmonary exam normal       Cardiovascular negative cardio ROS Normal cardiovascular exam+ Valvular Problems/Murmurs Rhythm:Regular Rate:Normal     Neuro/Psych negative neurological ROS  negative psych ROS   GI/Hepatic negative GI ROS, Neg liver ROS,   Endo/Other  negative endocrine ROS  Renal/GU negative Renal ROS  negative genitourinary   Musculoskeletal negative musculoskeletal ROS (+)   Abdominal   Peds  Hematology negative hematology ROS (+)   Anesthesia Other Findings   Reproductive/Obstetrics (+) Pregnancy Previous C/Section- Breech                             Anesthesia Physical Anesthesia Plan  ASA: II  Anesthesia Plan: Epidural   Post-op Pain Management:    Induction:   Airway Management Planned: Natural Airway  Additional Equipment:   Intra-op Plan:   Post-operative Plan:   Informed Consent: I have reviewed the patients History and Physical, chart, labs and discussed the procedure including the risks, benefits and alternatives for the proposed anesthesia with the patient or authorized representative who has indicated his/her understanding and acceptance.     Plan Discussed with: Anesthesiologist  Anesthesia Plan Comments:         Anesthesia Quick Evaluation

## 2015-03-04 NOTE — Progress Notes (Signed)
Patient up to bathroom with stedy and had syncope episode on toilet.  Patient brought back to bed by RNs, blood pressure WNL, fluid bolus given.  Bladder scan <122ml.

## 2015-03-05 ENCOUNTER — Encounter (HOSPITAL_COMMUNITY): Payer: Self-pay | Admitting: Obstetrics and Gynecology

## 2015-03-05 DIAGNOSIS — D62 Acute posthemorrhagic anemia: Secondary | ICD-10-CM

## 2015-03-05 HISTORY — DX: Acute posthemorrhagic anemia: D62

## 2015-03-05 LAB — CBC
HEMATOCRIT: 29.9 % — AB (ref 36.0–46.0)
Hemoglobin: 9.9 g/dL — ABNORMAL LOW (ref 12.0–15.0)
MCH: 30.1 pg (ref 26.0–34.0)
MCHC: 33.1 g/dL (ref 30.0–36.0)
MCV: 90.9 fL (ref 78.0–100.0)
PLATELETS: 152 10*3/uL (ref 150–400)
RBC: 3.29 MIL/uL — ABNORMAL LOW (ref 3.87–5.11)
RDW: 13.4 % (ref 11.5–15.5)
WBC: 11.3 10*3/uL — AB (ref 4.0–10.5)

## 2015-03-05 NOTE — Progress Notes (Signed)
Patient ID: Joy Farmer, female   DOB: [birth year], 29 y.o.   MRN: [id number] PPD # 1 SVD  S:  Reports feeling well             Tolerating po/ No nausea or vomiting             Bleeding is light             Pain controlled with ibuprofen (OTC)             Up ad lib / ambulatory / voiding without difficulties    Newborn  Information for the patient's newborn:  Loriana, Samad Girl Zhara [[id number]]  female  breast feeding very well and often    O:  A & O x 3, in no apparent distress              VS:  Filed Vitals:   03/04/15 2040 03/04/15 2145 03/05/15 0140 03/05/15 0628  BP: 102/66 109/68 112/59 103/80  Pulse: 106 98 86 80  Temp: 98.6 F (37 C) 98.4 F (36.9 C) 98.6 F (37 C) 98.1 F (36.7 C)  TempSrc: Oral Oral Oral Oral  Resp: Height:      Weight:      SpO2: 100% 100% 97%     LABS:  Recent Labs  03/04/15 0400 03/05/15 0540  WBC 6.4 11.3*  HGB 12.2 9.9*  HCT 35.8* 29.9*  PLT 184 152    Blood type: --/--/O POS (08/11 0400)  Rubella: Immune (01/28 0000)   I&O: I/O last 3 completed shifts: In: -  Out: 654 [Blood:654]             Lungs: Clear and unlabored  Heart: regular rate and rhythm / no murmurs  Abdomen: soft, non-tender, non-distended              Fundus: firm, non-tender, U-1  Perineum: 2nd degree repair healing well, no edema  Lochia: minimal  Extremities: No edema, no calf pain or tenderness, No Homans    A/P: PPD # 1  29 y.o., [id number]   Principal Problem:   Postpartum care following vaginal delivery (8/11) Active Problems:   Normal labor   Postpartum state   Acute blood loss anemia   Doing well - stable status  Routine post partum orders  Anticipate discharge tomorrow    Raelyn Mora, M, MSN, CNM 03/05/2015, 8:54 AM

## 2015-03-05 NOTE — Lactation Note (Signed)
This note was copied from the chart of Joy Sparrow [place]. Lactation Consultation Note  Patient Name: Joy Farmer OZHYQ'M Date: 03/05/2015 Reason for consult: Initial assessment Mom is experienced BF and feels this baby is latching well, however she does report sore nipples. No breakdown observed, baby has been cluster feeding today and now asleep so did not see latch. Advised to apply EBM, comfort gels given with instructions. Advised to continue to BF with feeding ques, Lactation brochure left for review, advised of OP services and support group. Encouraged Mom to call for assist with latch if discomfort not improving.   Maternal Data    Feeding    LATCH Score/Interventions          Comfort (Breast/Nipple): Filling, red/small blisters or bruises, mild/mod discomfort  Problem noted: Mild/Moderate discomfort        Lactation Tools Discussed/Used Tools: Comfort gels   Consult Status Consult Status: Follow-up Date: 03/06/15 Follow-up type: In-patient    Alfred Levins 03/05/2015, 4:58 PM

## 2015-03-05 NOTE — Anesthesia Postprocedure Evaluation (Signed)
  Anesthesia Post-op Note  Patient: Joy Farmer  Procedure(s) Performed: * No procedures listed *  Patient Location: Mother/Baby  Anesthesia Type:Epidural  Level of Consciousness: awake and alert   Airway and Oxygen Therapy: Patient Spontanous Breathing  Post-op Pain: mild  Post-op Assessment: Post-op Vital signs reviewed, Patient's Cardiovascular Status Stable, Respiratory Function Stable, No signs of Nausea or vomiting, Pain level controlled, No headache, Spinal receding and Patient able to bend at knees              Post-op Vital Signs: Reviewed  Last Vitals:  Filed Vitals:   03/05/15 0628  BP: 103/80  Pulse: 80  Temp: 36.7 C  Resp: 18    Complications: No apparent anesthesia complications

## 2015-03-06 DIAGNOSIS — O34219 Maternal care for unspecified type scar from previous cesarean delivery: Secondary | ICD-10-CM | POA: Diagnosis not present

## 2015-03-06 MED ORDER — OXYCODONE-ACETAMINOPHEN 5-325 MG PO TABS: 1.0000 | ORAL_TABLET | ORAL | Status: DC | PRN

## 2015-03-06 MED ORDER — IBUPROFEN 600 MG PO TABS: 600.0000 mg | ORAL_TABLET | Freq: Four times a day (QID) | ORAL | Status: DC

## 2015-03-06 MED ORDER — MAGNESIUM 200 MG PO TABS: 1.0000 | ORAL_TABLET | Freq: Every day | ORAL | Status: AC

## 2015-03-06 NOTE — Lactation Note (Signed)
This note was copied from the chart of Joy Rosana Farnell. Lactation Consultation Note: Experienced BF mom has baby latched to breast when I went into room. Reports some pain with initial patch but eases off after the first minute. Reports breasts are feeling a little fuller this morning. Asking for another set of comfort gels- she lost packaging for first set. No questions at present. To call prn  Patient Name: Joy Farmer [id number] Date: 03/06/2015 Reason for consult: Follow-up assessment   Maternal Data Formula Feeding for Exclusion: No Does the patient have breastfeeding experience prior to this delivery?: Yes  Feeding   LATCH Score/Interventions Latch: Grasps breast easily, tongue down, lips flanged, rhythmical sucking.  Audible Swallowing: A few with stimulation  Type of Nipple: Everted at rest and after stimulation  Comfort (Breast/Nipple): Filling, red/small blisters or bruises, mild/mod discomfort  Problem noted: Mild/Moderate discomfort Interventions (Mild/moderate discomfort): Comfort gels  Hold (Positioning): No assistance needed to correctly position infant at breast.  LATCH Score: 8  Lactation Tools Discussed/Used     Consult Status Consult Status: Complete    Pamelia Hoit 03/06/2015, 8:53 AM

## 2015-03-06 NOTE — Progress Notes (Signed)
Patient decided to have her baby bathed after all, but not until the a.m. (0800)

## 2015-03-06 NOTE — Discharge Summary (Signed)
Obstetric Discharge Summary  Reason for Admission: onset of labor Prenatal Procedures: none Intrapartum Procedures: spontaneous vaginal delivery and epidural Postpartum Procedures: none Complications-Operative and Postpartum: 2nd degree perineal laceration HEMOGLOBIN  Date Value Ref Range Status  03/05/2015 9.9* 12.0 - 15.0 g/dL Final   HCT  Date Value Ref Range Status  03/05/2015 29.9* 36.0 - 46.0 % Final    Physical Exam:  General: alert, cooperative and no distress Lochia: appropriate Uterine Fundus: firm Incision: healing well DVT Evaluation: No evidence of DVT seen on physical exam.  Discharge Diagnoses: Term Pregnancy-delivered  Discharge Information: Date: 03/06/2015 Activity: pelvic rest Diet: routine Medications: PNV, Ibuprofen, Iron and magneisum 200-400mg  with iron Condition: stable Instructions: refer to practice specific booklet Discharge to: home Follow-up Information    Follow up with LAVOIE,MARIE-LYNE, MD. Schedule an appointment as soon as possible for a visit in 6 weeks.   Specialty:  Obstetrics and Gynecology   Contact information:   [address] [place] [address] [phone]       Newborn Data: Live born female  Birth Weight: 6 lb 2.8 oz (2800 g) APGAR: 7, 8  Home with mother.  Marlinda Mike 03/06/2015, 10:20 AM

## 2015-03-06 NOTE — Progress Notes (Signed)
PPD 2 SVD / successful VBAC  S:  Reports feeling well - ready to go home             Tolerating po/ No nausea or vomiting             Bleeding is light             Pain controlled with motrin and percocet             Up ad lib / ambulatory / voiding QS  Newborn breast feeding  / female O:               VS: BP 108/68 mmHg  Pulse 97  Temp(Src) 98 F (36.7 C) (Oral)  Resp 18  Ht  (1.575 m)  Wt 55.792 kg (123 lb)  BMI 22.49 kg/m2  SpO2 97%  Breastfeeding? Unknown   LABS:              Recent Labs  03/04/15 0400 03/05/15 0540  WBC 6.4 11.3*  HGB 12.2 9.9*  PLT 184 152               Blood type: --/--/O POS (08/11 0400)  Rubella: Immune (01/28 0000)                  tdap current                  Physical Exam:             Alert and oriented X3  Abdomen: soft, non-tender, non-distended              Fundus: firm, non-tender, U-1  Perineum: no edema  Lochia: light  Extremities: no edema, no calf pain or tenderness    A: PPD # 2 VBAC  Doing well - stable status  P: Routine post partum orders  DC HOME - WOB booklet . Instructions reviewed  Marlinda Mike CNM, MSN, [facility] 03/06/2015, 10:15 AM

## 2015-03-06 NOTE — Discharge Instructions (Signed)
Avoid constipation - drink at lest 4 bottles of water / fresh fruits and vegetables / MOM if no BM in 3 days - warm fluids & soups  Prevention for constipation while taking iron - magnesium 200-400mg  daily

## 2016-11-29 IMAGING — US US MFM OB LIMITED
1 series · 13 of 19 positions shown · non-contrast
Comparison: none

[Series 1: us ob mfm follow up · 19 acquisitions, 13 frames shown]
[im 1/19]
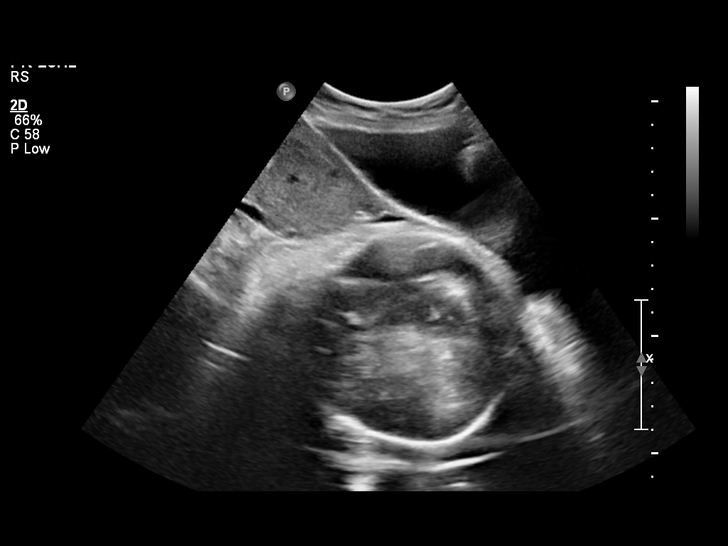
[im 3/19]
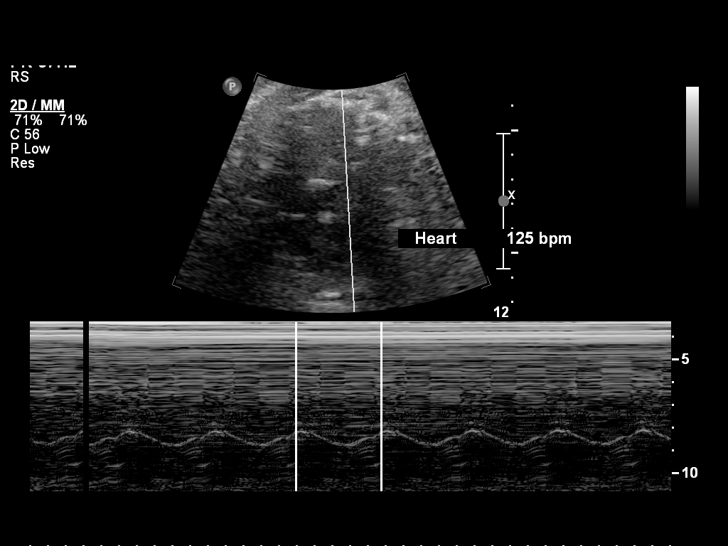
[im 4/19]
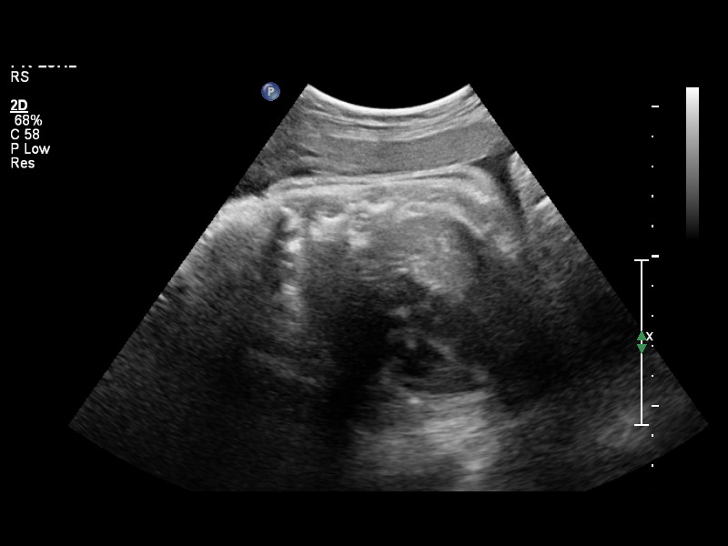
[im 6/19]
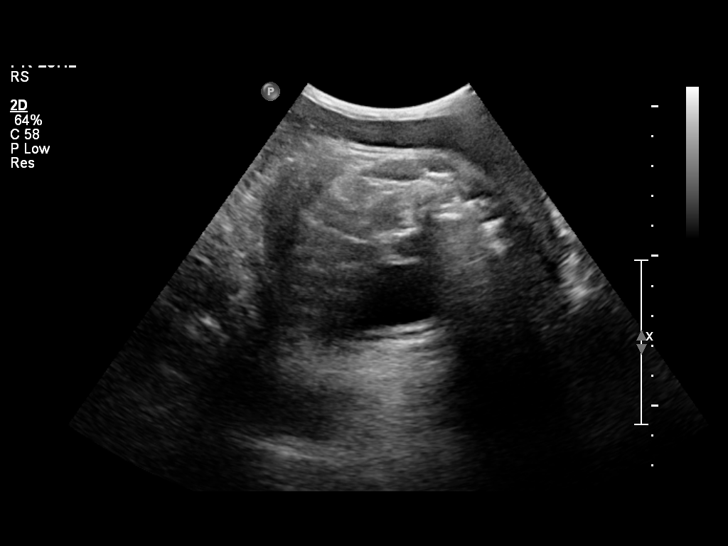
[im 7/19]
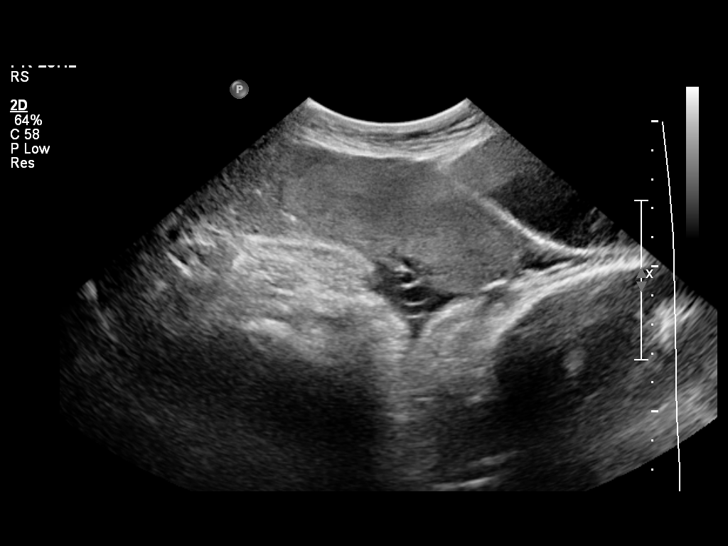
[im 9/19]
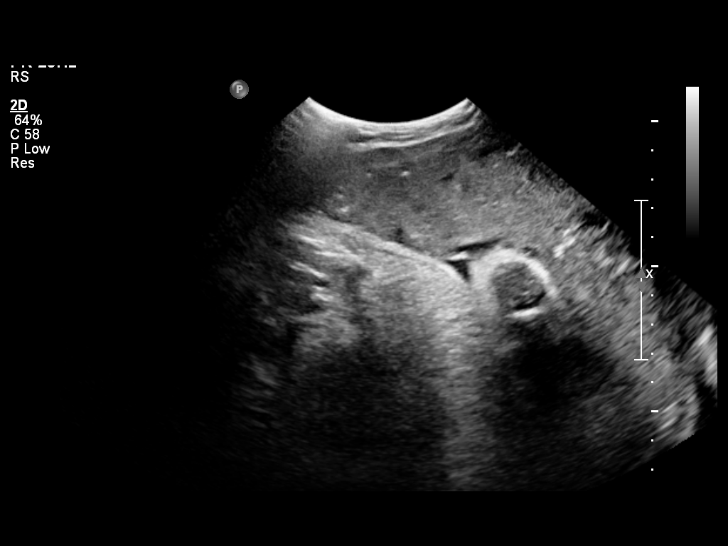
[im 10/19]
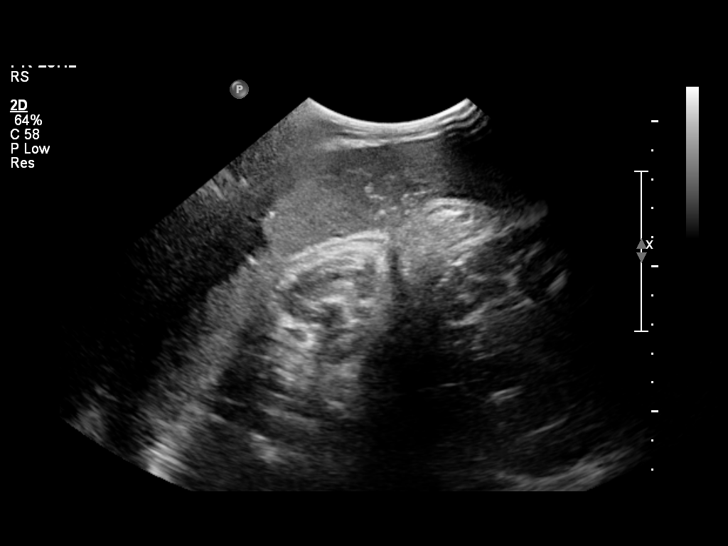
[im 11/19]
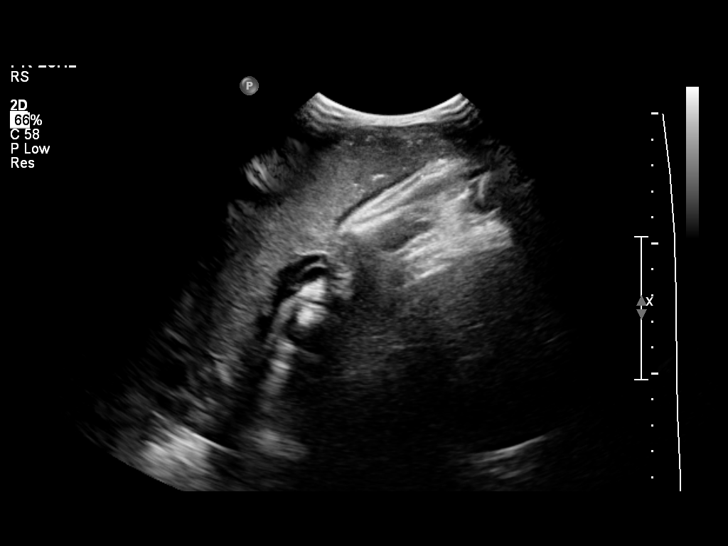
[im 13/19]
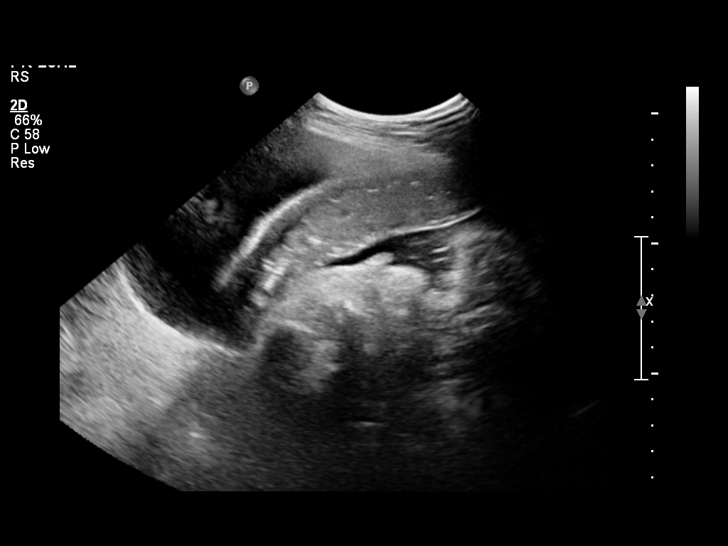
[im 14/19]
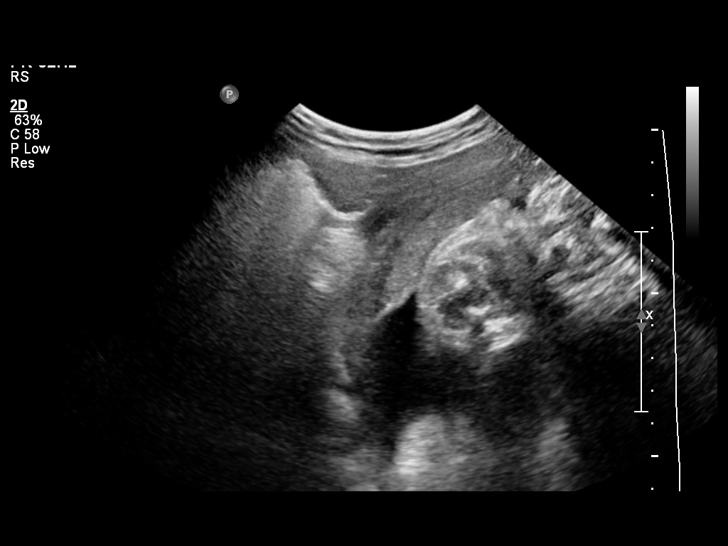
[im 16/19]
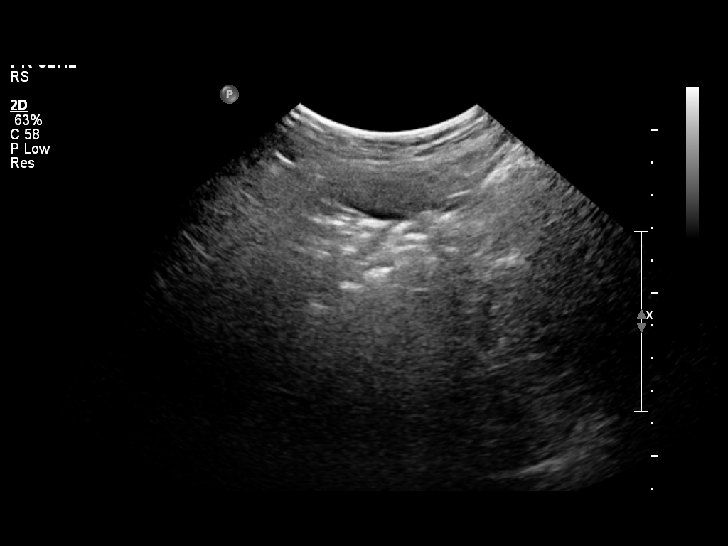
[im 17/19]
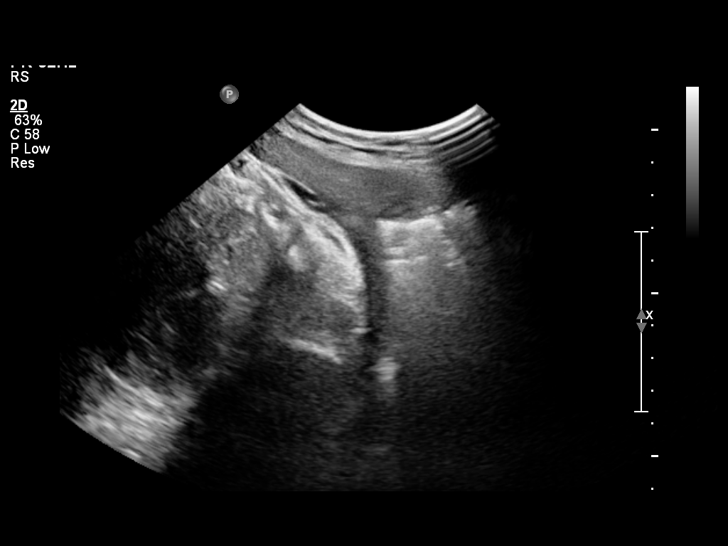
[im 19/19]
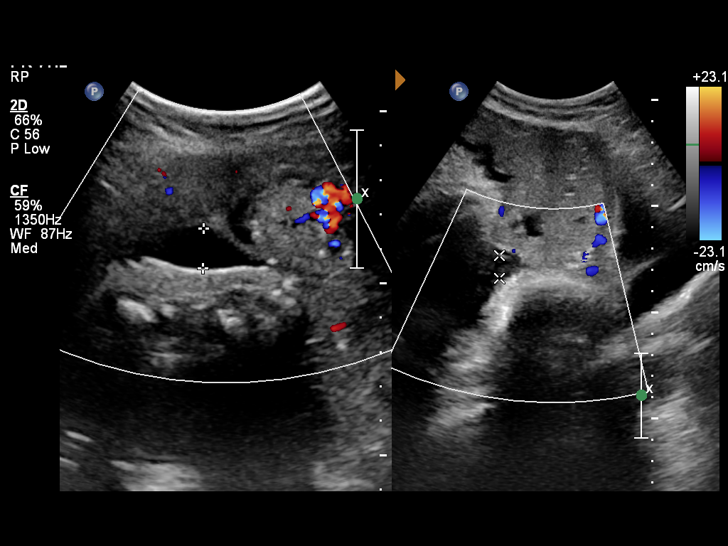

[13 of 19 positions shown; findings below may reference images not displayed]

OBSTETRICS REPORT
(Signed Final 03/04/2015 [DATE])

Name:       AMCIA SINO                      Visit  03/04/2015 [DATE]
Date:

Service(s) Provided

US MFM OB LIMITED                                      76815.01
Indications

39 weeks gestation of pregnancy
Determine fetal presentation using ultrasound          Z36
Spontaneous  Rupture of Membranes - leaking
fluid
Previous cesarean delivery
Fetal Evaluation

Num Of             1
Fetuses:
Fetal Heart        125                          bpm
Rate:
Cardiac Activity:  Observed
Presentation:      Cephalic
Placenta:          Anterior, above cervical
os
P. Cord            Not well visualized
Insertion:

Amniotic Fluid
AFI FV:      Subjectively decreased
AFI Sum:     5.32     cm     < 3  %Tile     Larg Pckt:      1.7  cm
RUQ:   1.15    cm    RLQ:   1.07    cm   LUQ:    1.4     cm   LLQ:    1.7    cm
Gestational Age

Clinical EDD:  39w 1d                                         EDD:   03/10/15
Best:          39w 1d    Det. By:   Clinical EDD              EDD:   03/10/15
Cervix Uterus Adnexa

Cervix:       Not visualized (advanced GA >37wks)
Uterus:       No abnormality visualized.
Cul De Sac:   No free fluid seen.

Left Ovary:    No adnexal mass visualized.
Right Ovary:   No adnexal mass visualized.
Adnexa:     No abnormality visualized.
Impression

Single IUP at 39w 1d
PROM
Limited ultrasound performed to determine fetal presentation
Cephalic presentation
Amniotic fluid volume subjectively decreased (AFI 5.3 cm)
Recommendations

Follow-up ultrasounds as clinically indicated.

## 2017-12-15 ENCOUNTER — Encounter (HOSPITAL_COMMUNITY): Payer: Self-pay | Admitting: Emergency Medicine

## 2017-12-15 ENCOUNTER — Emergency Department (HOSPITAL_COMMUNITY)
Admission: EM | Admit: 2017-12-15 | Discharge: 2017-12-15 | Disposition: A | Discharge status: Home / Self Care | Payer: Medicaid Other | Attending: Emergency Medicine | Admitting: Emergency Medicine

## 2017-12-15 ENCOUNTER — Other Ambulatory Visit: Payer: Self-pay

## 2017-12-15 DIAGNOSIS — R1011 Right upper quadrant pain: Secondary | ICD-10-CM | POA: Diagnosis not present

## 2017-12-15 DIAGNOSIS — Z79899 Other long term (current) drug therapy: Secondary | ICD-10-CM | POA: Diagnosis not present

## 2017-12-15 LAB — URINALYSIS, ROUTINE W REFLEX MICROSCOPIC
Bilirubin Urine: NEGATIVE
Glucose, UA: NEGATIVE mg/dL
Hgb urine dipstick: NEGATIVE
Ketones, ur: NEGATIVE mg/dL
Leukocytes, UA: NEGATIVE
Nitrite: NEGATIVE
Specific Gravity, Urine: 1.02 (ref 1.005–1.030)
pH: 8 (ref 5.0–8.0)

## 2017-12-15 LAB — CBC
HCT: 40.4 % (ref 36.0–46.0)
Hemoglobin: 13.5 g/dL (ref 12.0–15.0)
MCH: 29.6 pg (ref 26.0–34.0)
MCHC: 33.4 g/dL (ref 30.0–36.0)
MCV: 88.6 fL (ref 78.0–100.0)
Platelets: 246 K/uL (ref 150–400)
RBC: 4.56 MIL/uL (ref 3.87–5.11)
RDW: 11.8 % (ref 11.5–15.5)
WBC: 5.9 K/uL (ref 4.0–10.5)

## 2017-12-15 LAB — COMPREHENSIVE METABOLIC PANEL WITH GFR
ALT: 43 U/L (ref 14–54)
AST: 37 U/L (ref 15–41)
Albumin: 4.3 g/dL (ref 3.5–5.0)
Alkaline Phosphatase: 54 U/L (ref 38–126)
Anion gap: 10 (ref 5–15)
BUN: 14 mg/dL (ref 6–20)
CO2: 20 mmol/L — ABNORMAL LOW (ref 22–32)
Calcium: 9 mg/dL (ref 8.9–10.3)
Chloride: 110 mmol/L (ref 101–111)
Creatinine, Ser: 0.61 mg/dL (ref 0.44–1.00)
GFR calc Af Amer: >60 mL/min
GFR calc non Af Amer: >60 mL/min
Glucose, Bld: 100 mg/dL — ABNORMAL HIGH (ref 65–99)
Potassium: 3.4 mmol/L — ABNORMAL LOW (ref 3.5–5.1)
Sodium: 140 mmol/L (ref 135–145)
Total Bilirubin: 0.4 mg/dL (ref 0.3–1.2)
Total Protein: 7.3 g/dL (ref 6.5–8.1)

## 2017-12-15 LAB — LIPASE, BLOOD: Lipase: 45 U/L (ref 11–51)

## 2017-12-15 LAB — I-STAT BETA HCG BLOOD, ED (MC, WL, AP ONLY): I-stat hCG, quantitative: <5 m[IU]/mL

## 2017-12-15 MED ORDER — ONDANSETRON 4 MG PO TBDP: 4.0000 mg | ORAL_TABLET | Freq: Three times a day (TID) | ORAL | 0 refills | Status: AC | PRN

## 2017-12-15 NOTE — ED Triage Notes (Signed)
Pt brought in by EMS for c/o right upper quadrant pain that started about 45 minutes ago  Pt states pain woke her up  Vomiting x 1 episode

## 2017-12-15 NOTE — ED Provider Notes (Signed)
[facility]-EMERGENCY DEPT Provider Note   CSN: [id number] Arrival date & time: 12/15/17  0149     History   Chief Complaint Chief Complaint  Patient presents with  . Abdominal Pain    HPI Joy Farmer is a 32 y.o. female.  Patient presents after being awakened with sharp RUQ abdominal pain earlier this evening. She had asociated nausea and vomiting. Since arrival here she has had one bowel movement that was loose. No fever. The pain has resolved over time. She denies similar previous pain in the past. No urinary symptoms, vaginal symptoms, SOB, CP, cough. The pain was not noted to radiate.   The history is provided by the patient. No language interpreter was used.  Abdominal Pain   Associated symptoms include nausea and vomiting. Pertinent negatives include fever.    Past Medical History:  Diagnosis Date  . Acute blood loss anemia 03/05/2015  . Asthma   . Heart murmur   . Postpartum care following cesarean delivery (2/10) 09/02/2013  . Postpartum care following cesarean delivery (2/9) 09/02/2013  . Postpartum care following vaginal delivery (8/11) 03/05/2015    Patient Active Problem List   Diagnosis Date Noted  . VBAC, delivered 03/06/2015  . Postpartum care following vaginal delivery (8/11) 03/05/2015  . Acute blood loss anemia 03/05/2015    Past Surgical History:  Procedure Laterality Date  . CESAREAN SECTION N/A 09/01/2013   Procedure: CESAREAN SECTION;  Surgeon: Genia Del, MD;  Location: [facility];  Service: Obstetrics;  Laterality: N/A;     OB History    Gravida  2   Para  2   Term  2   Preterm      AB      Living  2     SAB      TAB      Ectopic      Multiple  0   Live Births  2            Home Medications    Prior to Admission medications   Medication Sig Start Date End Date Taking? Authorizing Provider  cetirizine (ZYRTEC) 10 MG tablet Take 10 mg by mouth daily.   Yes [provider]    diphenhydrAMINE (BENADRYL) 25 mg capsule Take 25 mg by mouth at bedtime.   Yes [provider]  medroxyPROGESTERone (DEPO-PROVERA) 150 MG/ML injection Inject 1 mL into the muscle every 3 (three) months. 11/29/17  Yes [provider]  ferrous sulfate 325 (65 FE) MG tablet Take 1 tablet (325 mg total) by mouth 2 (two) times daily with a meal. Patient not taking: Reported on 03/04/2015 09/04/13   Donette Larry, CNM  Magnesium 200 MG TABS Take 1-2 tablets (200-400 mg total) by mouth daily. Patient not taking: Reported on 12/15/2017 03/06/15   Marlinda Mike, CNM    Family History Family History  Problem Relation Age of Onset  . Hypertension Brother   . Diabetes Brother     Social History Social History   Tobacco Use  . Smoking status: Never Smoker  . Smokeless tobacco: Never Used  Substance Use Topics  . Alcohol use: No  . Drug use: No     Allergies   Shellfish allergy and Sulfa antibiotics   Review of Systems Review of Systems  Constitutional: Negative for chills and fever.  HENT: Negative.   Respiratory: Negative.   Cardiovascular: Negative.   Gastrointestinal: Positive for abdominal pain, nausea and vomiting.  Genitourinary: Negative.   Musculoskeletal: Negative.  Skin: Negative.   Neurological: Negative.      Physical Exam Updated Vital Signs BP 102/67 (BP Location: Right Arm)   Pulse 79   Temp 98 F (36.7 C)   Resp 20   SpO2 98%   Physical Exam  Constitutional: She appears well-developed and well-nourished.  HENT:  Head: Normocephalic.  Neck: Normal range of motion. Neck supple.  Cardiovascular: Normal rate and regular rhythm.  Pulmonary/Chest: Effort normal and breath sounds normal.  Abdominal: Soft. Bowel sounds are normal. There is tenderness (tenderness to deep palpation. ) in the right upper quadrant and right lower quadrant. There is no rigidity, no rebound and no guarding.  Musculoskeletal: Normal range of motion.  Neurological:  She is alert. No cranial nerve deficit.  Skin: Skin is warm and dry. No rash noted.  Psychiatric: She has a normal mood and affect.     ED Treatments / Results  Labs (all labs ordered are listed, but only abnormal results are displayed) Labs Reviewed  COMPREHENSIVE METABOLIC PANEL - Abnormal; Notable for the following components:      Result Value   Potassium 3.4 (*)    CO2 20 (*)    Glucose, Bld 100 (*)    All other components within normal limits  URINALYSIS, ROUTINE W REFLEX MICROSCOPIC - Abnormal; Notable for the following components:   Color, Urine YELLOW (*)    APPearance HAZY (*)    Protein, ur TRACE (*)    Bacteria, UA RARE (*)    All other components within normal limits  LIPASE, BLOOD  CBC  I-STAT BETA HCG BLOOD, ED ([facility], [facility], [facility] ONLY)    EKG None  Radiology No results found.  Procedures Procedures (including critical care time)  Medications Ordered in ED Medications - No data to display   Initial Impression / Assessment and Plan / ED Course  I have reviewed the triage vital signs and the nursing notes.  Pertinent labs & imaging results that were available during my care of the patient were reviewed by me and considered in my medical decision making (see chart for details).     The patient arrives via EMS with severe RUQ abdominal pain that started last night and woke her from sleep. No fever.  Labs reviewed and are normal. No leukocytosis or abnormal liver functions to suggest cholecystitis. She is subjectively pain free now. VSS.   DDx include gall stones vs cholecystitis, vs appy vs gastroenteritis.   Discussed imaging with the patient and her husband. Imaging is not felt to be beneficial to evaluate symptoms of RUQ pain without WBC count, LFT abnormals - doubt acute cholecystitis without persistent symptoms. RLQ tenderness found on exam not felt to be appy without leukocytosis or ongoing pain. Symptoms likely viral or too early to declare.    Discussed observing at home over the next 24 hours; to return if pain returns, is worse, or if she develops other symptoms of identifiable cause. Patient and husband are comfortable with plan to discharge home.   Final Clinical Impressions(s) / ED Diagnoses   Final diagnoses:  None   1. Abdominal pain  ED Discharge Orders    None       Elpidio Anis, PA-C 12/15/17 1610    Paula Libra, MD 12/15/17 [id number]    Paula Libra, MD 12/15/17 [phone]
